# Patient Record
Sex: Female | Born: 1970 | Race: White | Hispanic: No | Smoking: Former smoker
Health system: Southern US, Community
[De-identification: ages and names within clinical notes are randomized; demographics above are authoritative.]

## PROBLEM LIST (undated history)

## (undated) DIAGNOSIS — F32A Depression, unspecified: Secondary | ICD-10-CM

## (undated) DIAGNOSIS — T7840XA Allergy, unspecified, initial encounter: Secondary | ICD-10-CM

## (undated) DIAGNOSIS — F419 Anxiety disorder, unspecified: Secondary | ICD-10-CM

## (undated) DIAGNOSIS — N83209 Unspecified ovarian cyst, unspecified side: Secondary | ICD-10-CM

## (undated) DIAGNOSIS — F329 Major depressive disorder, single episode, unspecified: Secondary | ICD-10-CM

## (undated) HISTORY — DX: Unspecified ovarian cyst, unspecified side: N83.209

## (undated) HISTORY — PX: HERNIA REPAIR: SHX51

## (undated) HISTORY — DX: Depression, unspecified: F32.A

## (undated) HISTORY — DX: Anxiety disorder, unspecified: F41.9

## (undated) HISTORY — DX: Allergy, unspecified, initial encounter: T78.40XA

## (undated) HISTORY — DX: Major depressive disorder, single episode, unspecified: F32.9

---

## 1991-03-04 HISTORY — PX: TONSILLECTOMY: SUR1361

## 1998-03-03 HISTORY — PX: WISDOM TOOTH EXTRACTION: SHX21

## 2005-10-19 ENCOUNTER — Inpatient Hospital Stay: Payer: Self-pay | Admitting: Obstetrics and Gynecology

## 2005-12-13 ENCOUNTER — Ambulatory Visit: Payer: Self-pay | Admitting: Pediatrics

## 2010-01-31 HISTORY — PX: NASAL SINUS SURGERY: SHX719

## 2010-02-08 ENCOUNTER — Ambulatory Visit: Payer: Self-pay | Admitting: Unknown Physician Specialty

## 2010-02-14 ENCOUNTER — Ambulatory Visit: Payer: Self-pay | Admitting: Unknown Physician Specialty

## 2010-07-24 ENCOUNTER — Ambulatory Visit: Payer: Self-pay | Admitting: Unknown Physician Specialty

## 2010-12-02 ENCOUNTER — Encounter: Payer: Self-pay | Admitting: Obstetrics & Gynecology

## 2010-12-02 ENCOUNTER — Ambulatory Visit (INDEPENDENT_AMBULATORY_CARE_PROVIDER_SITE_OTHER): Payer: BC Managed Care – PPO | Admitting: Obstetrics & Gynecology

## 2010-12-02 VITALS — BP 112/66 | HR 85 | Ht 68.0 in | Wt 132.0 lb

## 2010-12-02 DIAGNOSIS — N949 Unspecified condition associated with female genital organs and menstrual cycle: Secondary | ICD-10-CM

## 2010-12-02 DIAGNOSIS — N938 Other specified abnormal uterine and vaginal bleeding: Secondary | ICD-10-CM

## 2010-12-02 DIAGNOSIS — F32A Depression, unspecified: Secondary | ICD-10-CM

## 2010-12-02 DIAGNOSIS — Z113 Encounter for screening for infections with a predominantly sexual mode of transmission: Secondary | ICD-10-CM

## 2010-12-02 DIAGNOSIS — Z1272 Encounter for screening for malignant neoplasm of vagina: Secondary | ICD-10-CM

## 2010-12-02 DIAGNOSIS — Z0000 Encounter for general adult medical examination without abnormal findings: Secondary | ICD-10-CM

## 2010-12-02 DIAGNOSIS — F329 Major depressive disorder, single episode, unspecified: Secondary | ICD-10-CM

## 2010-12-02 DIAGNOSIS — Z8632 Personal history of gestational diabetes: Secondary | ICD-10-CM

## 2010-12-02 MED ORDER — ESCITALOPRAM OXALATE 10 MG PO TABS: 10.0000 mg | ORAL_TABLET | Freq: Every day | ORAL | Status: DC

## 2010-12-02 NOTE — Progress Notes (Signed)
Subjective:    Patient ID: Bianca Walton, female    DOB: [birth year], 40 y.o.   MRN: [id number]  HPI    Review of Systems     Objective:   Physical Exam        Assessment & Plan:   Subjective:    Bianca Walton is a 40 y.o. female who presents for an annual exam. She has 2 complaints.  The first is that her cycle length has increased and this last period lasted 10 days (normally 5 or 6).  She also tells me that she would like to restart Lexapro as she feels herself becoming more and more "snappy". She has also been experiencing some ill-defined BL pelvic discomfort. The patient is sexually active. GYN screening history: last pap: was normal. The patient wears seatbelts: yes. The patient participates in regular exercise: yes. Has the patient ever been transfused or tattooed?: yes. (tattoo)The patient reports that there is not domestic violence in her life.   Menstrual History: OB History    Grav Para Term Preterm Abortions TAB SAB Ect Mult Living                  Menarche age: 3 Patient's last menstrual period was 11/20/2010.    The following portions of the patient's history were reviewed and updated as appropriate: allergies, current medications, past family history, past medical history, past social history, past surgical history and problem list.  Review of Systems A comprehensive review of systems was negative.   She is an Tourist information centre manager.  Her daughter is 85 yo and she and her husband use withdrawal for birth control.  She would be happy if she conceived. Objective:    BP 112/66  Pulse 85  Ht 5\' 8"  (1.727 m)  Wt 59.875 kg (132 lb)  BMI 20.07 kg/m2  LMP 11/20/2010  General Appearance:    Alert, cooperative, no distress, appears stated age  Head:    Normocephalic, without obvious abnormality, atraumatic  Eyes:    PERRL, conjunctiva/corneas clear, EOM's intact, fundi    benign, both eyes  Ears:    Normal TM's and external ear canals, both ears  Nose:    Nares normal, septum midline, mucosa normal, no drainage    or sinus tenderness  Throat:   Lips, mucosa, and tongue normal; teeth and gums normal  Neck:   Supple, symmetrical, trachea midline, no adenopathy;    thyroid:  no enlargement/tenderness/nodules; no carotid   bruit or JVD  Back:     Symmetric, no curvature, ROM normal, no CVA tenderness  Lungs:     Clear to auscultation bilaterally, respirations unlabored  Chest Wall:    No tenderness or deformity   Heart:    Regular rate and rhythm, S1 and S2 normal, no murmur, rub   or gallop  Breast Exam:    No tenderness, masses, or nipple abnormality  Abdomen:     Soft, non-tender, bowel sounds active all four quadrants,    no masses, no organomegaly  Genitalia:    Normal female without lesion, discharge or tenderness, NSSA, NT, no adnexal masses  Rectal:     Extremities:   Extremities normal, atraumatic, no cyanosis or edema  Pulses:   2+ and symmetric all extremities  Skin:   Skin color, texture, turgor normal, no rashes or lesions  Lymph nodes:   Cervical, supraclavicular, and axillary nodes normal  Neurologic:   CNII-XII intact, normal strength, sensation and reflexes    throughout  .  Assessment:    Healthy female exam.  Change in Menstrual pattern and some pelvic discomfort. Anxiety/depression History of borderline GDM   Plan:     Await pap smear results.  Check cvx cultures and TSH, RBS and Gyn u/s. Per her request, I will prescribe Lexapro.

## 2010-12-03 LAB — GLUCOSE, RANDOM: Glucose, Bld: 68 mg/dL — ABNORMAL LOW (ref 70–99)

## 2010-12-03 LAB — TSH: TSH: 1.855 u[IU]/mL (ref 0.350–4.500)

## 2011-01-21 ENCOUNTER — Telehealth: Payer: Self-pay

## 2011-01-21 DIAGNOSIS — F32A Depression, unspecified: Secondary | ICD-10-CM

## 2011-01-21 DIAGNOSIS — F329 Major depressive disorder, single episode, unspecified: Secondary | ICD-10-CM

## 2011-01-21 MED ORDER — ESCITALOPRAM OXALATE 10 MG PO TABS: 10.0000 mg | ORAL_TABLET | Freq: Every day | ORAL | Status: DC

## 2011-01-21 NOTE — Telephone Encounter (Signed)
PATIENT NEEDS A REFILL ON HER LEXAPRO 10MG  1 MONTH WORTH SHE IS OUT WILL BE IN NEXT MONTH FOR ANNUAL PHARMACY IS [facility] [address].

## 2011-07-14 ENCOUNTER — Ambulatory Visit: Payer: BC Managed Care – PPO | Admitting: Obstetrics & Gynecology

## 2011-09-03 ENCOUNTER — Emergency Department: Payer: Self-pay | Admitting: *Deleted

## 2012-04-15 ENCOUNTER — Ambulatory Visit: Payer: BC Managed Care – PPO | Admitting: Obstetrics & Gynecology

## 2012-04-27 ENCOUNTER — Ambulatory Visit: Payer: BC Managed Care – PPO | Admitting: Family Medicine

## 2012-04-30 ENCOUNTER — Ambulatory Visit: Payer: BC Managed Care – PPO | Admitting: Obstetrics & Gynecology

## 2012-05-27 ENCOUNTER — Ambulatory Visit (INDEPENDENT_AMBULATORY_CARE_PROVIDER_SITE_OTHER): Payer: BC Managed Care – PPO | Admitting: Family Medicine

## 2012-05-27 ENCOUNTER — Encounter: Payer: Self-pay | Admitting: Family Medicine

## 2012-05-27 VITALS — BP 105/69 | HR 81 | Ht 67.0 in | Wt 142.0 lb

## 2012-05-27 DIAGNOSIS — F411 Generalized anxiety disorder: Secondary | ICD-10-CM

## 2012-05-27 DIAGNOSIS — Z01419 Encounter for gynecological examination (general) (routine) without abnormal findings: Secondary | ICD-10-CM

## 2012-05-27 DIAGNOSIS — Z124 Encounter for screening for malignant neoplasm of cervix: Secondary | ICD-10-CM

## 2012-05-27 DIAGNOSIS — J45909 Unspecified asthma, uncomplicated: Secondary | ICD-10-CM | POA: Insufficient documentation

## 2012-05-27 DIAGNOSIS — R531 Weakness: Secondary | ICD-10-CM

## 2012-05-27 DIAGNOSIS — Z1151 Encounter for screening for human papillomavirus (HPV): Secondary | ICD-10-CM

## 2012-05-27 DIAGNOSIS — F419 Anxiety disorder, unspecified: Secondary | ICD-10-CM

## 2012-05-27 DIAGNOSIS — J309 Allergic rhinitis, unspecified: Secondary | ICD-10-CM | POA: Insufficient documentation

## 2012-05-27 DIAGNOSIS — R5381 Other malaise: Secondary | ICD-10-CM

## 2012-05-27 LAB — LIPID PANEL
Cholesterol: 219 mg/dL — ABNORMAL HIGH (ref 0–200)
HDL: 73 mg/dL (ref >39)
LDL Cholesterol: 129 mg/dL — ABNORMAL HIGH (ref 0–99)
Total CHOL/HDL Ratio: 3 Ratio
Triglycerides: 84 mg/dL (ref <150)
VLDL: 17 mg/dL (ref 0–40)

## 2012-05-27 LAB — COMPREHENSIVE METABOLIC PANEL
ALT: 16 U/L (ref 0–35)
AST: 17 U/L (ref 0–37)
Albumin: 4.7 g/dL (ref 3.5–5.2)
Alkaline Phosphatase: 35 U/L — ABNORMAL LOW (ref 39–117)
BUN: 19 mg/dL (ref 6–23)
CO2: 29 mEq/L (ref 19–32)
Calcium: 9.4 mg/dL (ref 8.4–10.5)
Chloride: 104 mEq/L (ref 96–112)
Creat: 0.82 mg/dL (ref 0.50–1.10)
Glucose, Bld: 99 mg/dL (ref 70–99)
Potassium: 4.7 mEq/L (ref 3.5–5.3)
Sodium: 139 mEq/L (ref 135–145)
Total Bilirubin: 0.6 mg/dL (ref 0.3–1.2)
Total Protein: 6.5 g/dL (ref 6.0–8.3)

## 2012-05-27 LAB — CBC
HCT: 41.8 % (ref 36.0–46.0)
Hemoglobin: 14 g/dL (ref 12.0–15.0)
MCH: 31 pg (ref 26.0–34.0)
MCHC: 33.5 g/dL (ref 30.0–36.0)
MCV: 92.5 fL (ref 78.0–100.0)
Platelets: 233 10*3/uL (ref 150–400)
RBC: 4.52 MIL/uL (ref 3.87–5.11)
RDW: 13.2 % (ref 11.5–15.5)
WBC: 4.8 10*3/uL (ref 4.0–10.5)

## 2012-05-27 NOTE — Patient Instructions (Signed)
Calorie Counting Diet A calorie counting diet requires you to eat the number of calories that are right for you in a day. Calories are the measurement of how much energy you get from the food you eat. Eating the right amount of calories is important for staying at a healthy weight. If you eat too many calories, your body will store them as fat and you may gain weight. If you eat too few calories, you may lose weight. Counting the number of calories you eat during a day will help you know if you are eating the right amount. A Registered Dietitian can determine how many calories you need in a day. The amount of calories needed varies from person to person. If your goal is to lose weight, you will need to eat fewer calories. Losing weight can benefit you if you are overweight or have health problems such as heart disease, high blood pressure, or diabetes. If your goal is to gain weight, you will need to eat more calories. Gaining weight may be necessary if you have a certain health problem that causes your body to need more energy. TIPS Whether you are increasing or decreasing the number of calories you eat during a day, it may be hard to get used to changes in what you eat and drink. The following are tips to help you keep track of the number of calories you eat.  Measure foods at home with measuring cups. This helps you know the amount of food and number of calories you are eating.  Restaurants often serve food in amounts that are larger than 1 serving. While eating out, estimate how many servings of a food you are given. For example, a serving of cooked rice is  cup or about the size of half of a fist. Knowing serving sizes will help you be aware of how much food you are eating at restaurants.  Ask for smaller portion sizes or child-size portions at restaurants.  Plan to eat half of a meal at a restaurant. Take the rest home or share the other half with a friend.  Read the Nutrition Facts panel on  food labels for calorie content and serving size. You can find out how many servings are in a package, the size of a serving, and the number of calories each serving has.  For example, a package might contain 3 cookies. The Nutrition Facts panel on that package says that 1 serving is 1 cookie. Below that, it will say there are 3 servings in the container. The calories section of the Nutrition Facts label says there are 90 calories. This means there are 90 calories in 1 cookie (1 serving). If you eat 1 cookie you have eaten 90 calories. If you eat all 3 cookies, you have eaten 270 calories (3 servings x 90 calories = 270 calories). The list below tells you how big or small some common portion sizes are.  1 oz.........4 stacked dice.  3 oz.........Deck of cards.  1 tsp........Tip of little finger.  1 tbs........Thumb.  2 tbs........Golf ball.   cup.......Half of a fist.  1 cup........A fist. KEEP A FOOD LOG Write down every food item you eat, the amount you eat, and the number of calories in each food you eat during the day. At the end of the day, you can add up the total number of calories you have eaten. It may help to keep a list like the one below. Find out the calorie information by reading the   Nutrition Facts panel on food labels. Breakfast  Bran cereal (1 cup, 110 calories).  Fat-free milk ( cup, 45 calories). Snack  Apple (1 medium, 80 calories). Lunch  Spinach (1 cup, 20 calories).  Tomato ( medium, 20 calories).  Chicken breast strips (3 oz, 165 calories).  Shredded cheddar cheese ( cup, 110 calories).  Light [place] dressing (2 tbs, 60 calories).  Whole-wheat bread (1 slice, 80 calories).  Tub margarine (1 tsp, 35 calories).  Vegetable soup (1 cup, 160 calories). Dinner  Pork chop (3 oz, 190 calories).  Brown rice (1 cup, 215 calories).  Steamed broccoli ( cup, 20 calories).  Strawberries (1  cup, 65 calories).  Whipped cream (1 tbs, 50  calories). Daily Calorie Total: 1425 Document Released: 02/17/2005 Document Revised: 05/12/2011 Document Reviewed: 08/14/2006 [facility] Patient Information 2013 [place], [place]. Preventive Care for Adults, Female A healthy lifestyle and preventive care can promote health and wellness. Preventive health guidelines for women include the following key practices.  A routine yearly physical is a good way to check with your caregiver about your health and preventive screening. It is a chance to share any concerns and updates on your health, and to receive a thorough exam.  Visit your dentist for a routine exam and preventive care every 6 months. Brush your teeth twice a day and floss once a day. Good oral hygiene prevents tooth decay and gum disease.  The frequency of eye exams is based on your age, health, family medical history, use of contact lenses, and other factors. Follow your caregiver's recommendations for frequency of eye exams.  Eat a healthy diet. Foods like vegetables, fruits, whole grains, low-fat dairy products, and lean protein foods contain the nutrients you need without too many calories. Decrease your intake of foods high in solid fats, added sugars, and salt. Eat the right amount of calories for you.Get information about a proper diet from your caregiver, if necessary.  Regular physical exercise is one of the most important things you can do for your health. Most adults should get at least 150 minutes of moderate-intensity exercise (any activity that increases your heart rate and causes you to sweat) each week. In addition, most adults need muscle-strengthening exercises on 2 or more days a week.  Maintain a healthy weight. The body mass index (BMI) is a screening tool to identify possible weight problems. It provides an estimate of body fat based on height and weight. Your caregiver can help determine your BMI, and can help you achieve or maintain a healthy weight.For adults 20 years  and older:  A BMI below 18.5 is considered underweight.  A BMI of 18.5 to 24.9 is normal.  A BMI of 25 to 29.9 is considered overweight.  A BMI of 30 and above is considered obese.  Maintain normal blood lipids and cholesterol levels by exercising and minimizing your intake of saturated fat. Eat a balanced diet with plenty of fruit and vegetables. Blood tests for lipids and cholesterol should begin at age 37 and be repeated every 5 years. If your lipid or cholesterol levels are high, you are over 50, or you are at high risk for heart disease, you may need your cholesterol levels checked more frequently.Ongoing high lipid and cholesterol levels should be treated with medicines if diet and exercise are not effective.  If you smoke, find out from your caregiver how to quit. If you do not use tobacco, do not start.  If you are pregnant, do not drink alcohol. If you  are breastfeeding, be very cautious about drinking alcohol. If you are not pregnant and choose to drink alcohol, do not exceed 1 drink per day. One drink is considered to be 12 ounces (355 mL) of beer, 5 ounces (148 mL) of wine, or 1.5 ounces (44 mL) of liquor.  Avoid use of street drugs. Do not share needles with anyone. Ask for help if you need support or instructions about stopping the use of drugs.  High blood pressure causes heart disease and increases the risk of stroke. Your blood pressure should be checked at least every 1 to 2 years. Ongoing high blood pressure should be treated with medicines if weight loss and exercise are not effective.  If you are 62 to 41 years old, ask your caregiver if you should take aspirin to prevent strokes.  Diabetes screening involves taking a blood sample to check your fasting blood sugar level. This should be done once every 3 years, after age 70, if you are within normal weight and without risk factors for diabetes. Testing should be considered at a younger age or be carried out more frequently  if you are overweight and have at least 1 risk factor for diabetes.  Breast cancer screening is essential preventive care for women. You should practice "breast self-awareness." This means understanding the normal appearance and feel of your breasts and may include breast self-examination. Any changes detected, no matter how small, should be reported to a caregiver. Women in their 67s and 30s should have a clinical breast exam (CBE) by a caregiver as part of a regular health exam every 1 to 3 years. After age 58, women should have a CBE every year. Starting at age 96, women should consider having a mammography (breast X-ray test) every year. Women who have a family history of breast cancer should talk to their caregiver about genetic screening. Women at a high risk of breast cancer should talk to their caregivers about having magnetic resonance imaging (MRI) and a mammography every year.  The Pap test is a screening test for cervical cancer. A Pap test can show cell changes on the cervix that might become cervical cancer if left untreated. A Pap test is a procedure in which cells are obtained and examined from the lower end of the uterus (cervix).  Women should have a Pap test starting at age 1.  Between ages 29 and 25, Pap tests should be repeated every 2 years.  Beginning at age 62, you should have a Pap test every 3 years as long as the past 3 Pap tests have been normal.  Some women have medical problems that increase the chance of getting cervical cancer. Talk to your caregiver about these problems. It is especially important to talk to your caregiver if a new problem develops soon after your last Pap test. In these cases, your caregiver may recommend more frequent screening and Pap tests.  The above recommendations are the same for women who have or have not gotten the vaccine for human papillomavirus (HPV).  If you had a hysterectomy for a problem that was not cancer or a condition that could  lead to cancer, then you no longer need Pap tests. Even if you no longer need a Pap test, a regular exam is a good idea to make sure no other problems are starting.  If you are between ages 75 and 15, and you have had normal Pap tests going back 10 years, you no longer need Pap tests. Even if  you no longer need a Pap test, a regular exam is a good idea to make sure no other problems are starting.  If you have had past treatment for cervical cancer or a condition that could lead to cancer, you need Pap tests and screening for cancer for at least 20 years after your treatment.  If Pap tests have been discontinued, risk factors (such as a new sexual partner) need to be reassessed to determine if screening should be resumed.  The HPV test is an additional test that may be used for cervical cancer screening. The HPV test looks for the virus that can cause the cell changes on the cervix. The cells collected during the Pap test can be tested for HPV. The HPV test could be used to screen women aged 38 years and older, and should be used in women of any age who have unclear Pap test results. After the age of 83, women should have HPV testing at the same frequency as a Pap test.  Colorectal cancer can be detected and often prevented. Most routine colorectal cancer screening begins at the age of 72 and continues through age 41. However, your caregiver may recommend screening at an earlier age if you have risk factors for colon cancer. On a yearly basis, your caregiver may provide home test kits to check for hidden blood in the stool. Use of a small camera at the end of a tube, to directly examine the colon (sigmoidoscopy or colonoscopy), can detect the earliest forms of colorectal cancer. Talk to your caregiver about this at age 30, when routine screening begins. Direct examination of the colon should be repeated every 5 to 10 years through age 17, unless early forms of pre-cancerous polyps or small growths are  found.  Hepatitis C blood testing is recommended for all people born from [birth year] through 1965 and any individual with known risks for hepatitis C.  Practice safe sex. Use condoms and avoid high-risk sexual practices to reduce the spread of sexually transmitted infections (STIs). STIs include gonorrhea, chlamydia, syphilis, trichomonas, herpes, HPV, and human immunodeficiency virus (HIV). Herpes, HIV, and HPV are viral illnesses that have no cure. They can result in disability, cancer, and death. Sexually active women aged 6 and younger should be checked for chlamydia. Older women with new or multiple partners should also be tested for chlamydia. Testing for other STIs is recommended if you are sexually active and at increased risk.  Osteoporosis is a disease in which the bones lose minerals and strength with aging. This can result in serious bone fractures. The risk of osteoporosis can be identified using a bone density scan. Women ages 21 and over and women at risk for fractures or osteoporosis should discuss screening with their caregivers. Ask your caregiver whether you should take a calcium supplement or vitamin D to reduce the rate of osteoporosis.  Menopause can be associated with physical symptoms and risks. Hormone replacement therapy is available to decrease symptoms and risks. You should talk to your caregiver about whether hormone replacement therapy is right for you.  Use sunscreen with sun protection factor (SPF) of 30 or more. Apply sunscreen liberally and repeatedly throughout the day. You should seek shade when your shadow is shorter than you. Protect yourself by wearing long sleeves, pants, a wide-brimmed hat, and sunglasses year round, whenever you are outdoors.  Once a month, do a whole body skin exam, using a mirror to look at the skin on your back. Notify your caregiver of  new moles, moles that have irregular borders, moles that are larger than a pencil eraser, or moles that have  changed in shape or color.  Stay current with required immunizations.  Influenza. You need a dose every fall (or winter). The composition of the flu vaccine changes each year, so being vaccinated once is not enough.  Pneumococcal polysaccharide. You need 1 to 2 doses if you smoke cigarettes or if you have certain chronic medical conditions. You need 1 dose at age 45 (or older) if you have never been vaccinated.  Tetanus, diphtheria, pertussis (Tdap, Td). Get 1 dose of Tdap vaccine if you are younger than age 4, are over 23 and have contact with an infant, are a Research scientist (physical sciences), are pregnant, or simply want to be protected from whooping cough. After that, you need a Td booster dose every 10 years. Consult your caregiver if you have not had at least 3 tetanus and diphtheria-containing shots sometime in your life or have a deep or dirty wound.  HPV. You need this vaccine if you are a woman age 28 or younger. The vaccine is given in 3 doses over 6 months.  Measles, mumps, rubella (MMR). You need at least 1 dose of MMR if you were born in 1957 or later. You may also need a second dose.  Meningococcal. If you are age 27 to 91 and a first-year college student living in a residence hall, or have one of several medical conditions, you need to get vaccinated against meningococcal disease. You may also need additional booster doses.  Zoster (shingles). If you are age 6 or older, you should get this vaccine.  Varicella (chickenpox). If you have never had chickenpox or you were vaccinated but received only 1 dose, talk to your caregiver to find out if you need this vaccine.  Hepatitis A. You need this vaccine if you have a specific risk factor for hepatitis A virus infection or you simply wish to be protected from this disease. The vaccine is usually given as 2 doses, 6 to 18 months apart.  Hepatitis B. You need this vaccine if you have a specific risk factor for hepatitis B virus infection or you  simply wish to be protected from this disease. The vaccine is given in 3 doses, usually over 6 months. Preventive Services / Frequency Ages 57 to 75  Blood pressure check.** / Every 1 to 2 years.  Lipid and cholesterol check.** / Every 5 years beginning at age 63.  Clinical breast exam.** / Every 3 years for women in their 5s and 30s.  Pap test.** / Every 2 years from ages 16 through 85. Every 3 years starting at age 17 through age 19 or 45 with a history of 3 consecutive normal Pap tests.  HPV screening.** / Every 3 years from ages 65 through ages 11 to 48 with a history of 3 consecutive normal Pap tests.  Hepatitis C blood test.** / For any individual with known risks for hepatitis C.  Skin self-exam. / Monthly.  Influenza immunization.** / Every year.  Pneumococcal polysaccharide immunization.** / 1 to 2 doses if you smoke cigarettes or if you have certain chronic medical conditions.  Tetanus, diphtheria, pertussis (Tdap, Td) immunization. / A one-time dose of Tdap vaccine. After that, you need a Td booster dose every 10 years.  HPV immunization. / 3 doses over 6 months, if you are 19 and younger.  Measles, mumps, rubella (MMR) immunization. / You need at least 1 dose of MMR  if you were born in 1957 or later. You may also need a second dose.  Meningococcal immunization. / 1 dose if you are age 50 to 83 and a first-year college student living in a residence hall, or have one of several medical conditions, you need to get vaccinated against meningococcal disease. You may also need additional booster doses.  Varicella immunization.** / Consult your caregiver.  Hepatitis A immunization.** / Consult your caregiver. 2 doses, 6 to 18 months apart.  Hepatitis B immunization.** / Consult your caregiver. 3 doses usually over 6 months. Ages 26 to 66  Blood pressure check.** / Every 1 to 2 years.  Lipid and cholesterol check.** / Every 5 years beginning at age 46.  Clinical breast  exam.** / Every year after age 62.  Mammogram.** / Every year beginning at age 63 and continuing for as long as you are in good health. Consult with your caregiver.  Pap test.** / Every 3 years starting at age 25 through age 18 or 31 with a history of 3 consecutive normal Pap tests.  HPV screening.** / Every 3 years from ages 5 through ages 24 to 26 with a history of 3 consecutive normal Pap tests.  Fecal occult blood test (FOBT) of stool. / Every year beginning at age 26 and continuing until age 80. You may not need to do this test if you get a colonoscopy every 10 years.  Flexible sigmoidoscopy or colonoscopy.** / Every 5 years for a flexible sigmoidoscopy or every 10 years for a colonoscopy beginning at age 25 and continuing until age 80.  Hepatitis C blood test.** / For all people born from [birth year] through 1964 and any individual with known risks for hepatitis C.  Skin self-exam. / Monthly.  Influenza immunization.** / Every year.  Pneumococcal polysaccharide immunization.** / 1 to 2 doses if you smoke cigarettes or if you have certain chronic medical conditions.  Tetanus, diphtheria, pertussis (Tdap, Td) immunization.** / A one-time dose of Tdap vaccine. After that, you need a Td booster dose every 10 years.  Measles, mumps, rubella (MMR) immunization. / You need at least 1 dose of MMR if you were born in 1957 or later. You may also need a second dose.  Varicella immunization.** / Consult your caregiver.  Meningococcal immunization.** / Consult your caregiver.  Hepatitis A immunization.** / Consult your caregiver. 2 doses, 6 to 18 months apart.  Hepatitis B immunization.** / Consult your caregiver. 3 doses, usually over 6 months. Ages 11 and over  Blood pressure check.** / Every 1 to 2 years.  Lipid and cholesterol check.** / Every 5 years beginning at age 30.  Clinical breast exam.** / Every year after age 32.  Mammogram.** / Every year beginning at age 95 and continuing  for as long as you are in good health. Consult with your caregiver.  Pap test.** / Every 3 years starting at age 91 through age 52 or 18 with a 3 consecutive normal Pap tests. Testing can be stopped between 65 and 70 with 3 consecutive normal Pap tests and no abnormal Pap or HPV tests in the past 10 years.  HPV screening.** / Every 3 years from ages 23 through ages 30 or 44 with a history of 3 consecutive normal Pap tests. Testing can be stopped between 65 and 70 with 3 consecutive normal Pap tests and no abnormal Pap or HPV tests in the past 10 years.  Fecal occult blood test (FOBT) of stool. / Every year beginning at age  50 and continuing until age 83. You may not need to do this test if you get a colonoscopy every 10 years.  Flexible sigmoidoscopy or colonoscopy.** / Every 5 years for a flexible sigmoidoscopy or every 10 years for a colonoscopy beginning at age 45 and continuing until age 4.  Hepatitis C blood test.** / For all people born from [birth year] through 1964 and any individual with known risks for hepatitis C.  Osteoporosis screening.** / A one-time screening for women ages 50 and over and women at risk for fractures or osteoporosis.  Skin self-exam. / Monthly.  Influenza immunization.** / Every year.  Pneumococcal polysaccharide immunization.** / 1 dose at age 10 (or older) if you have never been vaccinated.  Tetanus, diphtheria, pertussis (Tdap, Td) immunization. / A one-time dose of Tdap vaccine if you are over 65 and have contact with an infant, are a Research scientist (physical sciences), or simply want to be protected from whooping cough. After that, you need a Td booster dose every 10 years.  Varicella immunization.** / Consult your caregiver.  Meningococcal immunization.** / Consult your caregiver.  Hepatitis A immunization.** / Consult your caregiver. 2 doses, 6 to 18 months apart.  Hepatitis B immunization.** / Check with your caregiver. 3 doses, usually over 6 months. ** Family history  and personal history of risk and conditions may change your caregiver's recommendations. Document Released: 04/15/2001 Document Revised: 05/12/2011 Document Reviewed: 07/15/2010 [facility] Patient Information 2013 [place], [place].

## 2012-05-27 NOTE — Progress Notes (Signed)
  Subjective:     Bianca Walton is a 42 y.o. female and is here for a comprehensive physical exam. The patient reports spotting x 1 after her last cycle, some 2 wks later for one day.  Also, is now gaining weight and trying to lose 10 lbs.  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  Withdrawal method for contraception.  Reports chronic constipation, improves with fiber.  Having lower abd. Pain which has resolved since improved bowel habits.  History   Social History  . Marital Status: Married    Spouse Name: N/A    Number of Children: N/A  . Years of Education: N/A   Occupational History  . Not on file.   Social History Main Topics  . Smoking status: Former Smoker    Quit date: 12/01/2005  . Smokeless tobacco: Not on file  . Alcohol Use: Yes     Comment: 3 drinks a week.  . Drug Use: No  . Sexually Active: Yes -- Female partner(s)    Birth Control/ Protection: Coitus interruptus   Other Topics Concern  . Not on file   Social History Narrative  . No narrative on file   Health Maintenance  Topic Date Due  . Influenza Vaccine  11/01/1972  . Tetanus/tdap  02/12/1991  . Pap Smear  12/01/2013   Works as a Runner, broadcasting/film/video, Armed forces logistics/support/administrative officer.  The following portions of the patient's history were reviewed and updated as appropriate: allergies, current medications, past family history, past medical history, past social history, past surgical history and problem list.  Review of Systems Pertinent items are noted in HPI.   Objective:    BP 105/69  Pulse 81  Ht 5\' 7"  (1.702 m)  Wt 142 lb (64.411 kg)  BMI 22.24 kg/m2  LMP 05/06/2012 General appearance: alert, cooperative and appears stated age Head: Normocephalic, without obvious abnormality, atraumatic Neck: no adenopathy, supple, symmetrical, trachea midline and thyroid not enlarged, symmetric, no tenderness/mass/nodules Lungs: clear to auscultation bilaterally Breasts: normal  appearance, no masses or tenderness Heart: regular rate and rhythm, S1, S2 normal, no murmur, click, rub or gallop Abdomen: soft, non-tender; bowel sounds normal; no masses,  no organomegaly Pelvic: cervix normal in appearance, external genitalia normal, no adnexal masses or tenderness, no cervical motion tenderness, uterus normal size, shape, and consistency and vagina normal without discharge Extremities: extremities normal, atraumatic, no cyanosis or edema Pulses: 2+ and symmetric Skin: Skin color, texture, turgor normal. No rashes or lesions Lymph nodes: Cervical, supraclavicular, and axillary nodes normal. Neurologic: Grossly normal    Assessment:    Healthy female exam.    Plan:  Pap smear Annual mammogram Screening blood work.   See After Visit Summary for Counseling Recommendations

## 2012-05-28 LAB — TSH: TSH: 1.967 u[IU]/mL (ref 0.350–4.500)

## 2012-05-31 ENCOUNTER — Telehealth: Payer: Self-pay | Admitting: *Deleted

## 2012-05-31 NOTE — Telephone Encounter (Signed)
Notified patient of her lab results and advised a low cholesterol diet.

## 2012-06-01 ENCOUNTER — Ambulatory Visit (HOSPITAL_COMMUNITY): Payer: BC Managed Care – PPO

## 2013-12-13 ENCOUNTER — Ambulatory Visit: Payer: BC Managed Care – PPO | Admitting: Obstetrics & Gynecology

## 2014-01-02 ENCOUNTER — Encounter: Payer: Self-pay | Admitting: Family Medicine

## 2014-07-17 ENCOUNTER — Ambulatory Visit: Payer: BC Managed Care – PPO | Admitting: Obstetrics & Gynecology

## 2014-08-07 ENCOUNTER — Ambulatory Visit (INDEPENDENT_AMBULATORY_CARE_PROVIDER_SITE_OTHER): Payer: BC Managed Care – PPO | Admitting: Family Medicine

## 2014-08-07 ENCOUNTER — Encounter: Payer: Self-pay | Admitting: Family Medicine

## 2014-08-07 VITALS — BP 112/74 | HR 71 | Ht 67.0 in | Wt 149.0 lb

## 2014-08-07 DIAGNOSIS — Z01419 Encounter for gynecological examination (general) (routine) without abnormal findings: Secondary | ICD-10-CM

## 2014-08-07 DIAGNOSIS — Z1151 Encounter for screening for human papillomavirus (HPV): Secondary | ICD-10-CM

## 2014-08-07 DIAGNOSIS — Z124 Encounter for screening for malignant neoplasm of cervix: Secondary | ICD-10-CM

## 2014-08-07 DIAGNOSIS — N939 Abnormal uterine and vaginal bleeding, unspecified: Secondary | ICD-10-CM

## 2014-08-07 DIAGNOSIS — K9041 Non-celiac gluten sensitivity: Secondary | ICD-10-CM

## 2014-08-07 DIAGNOSIS — Z91018 Allergy to other foods: Secondary | ICD-10-CM

## 2014-08-07 NOTE — Patient Instructions (Addendum)
Preventive Care for Adults A healthy lifestyle and preventive care can promote health and wellness. Preventive health guidelines for women include the following key practices.  A routine yearly physical is a good way to check with your health care provider about your health and preventive screening. It is a chance to share any concerns and updates on your health and to receive a thorough exam.  Visit your dentist for a routine exam and preventive care every 6 months. Brush your teeth twice a day and floss once a day. Good oral hygiene prevents tooth decay and gum disease.  The frequency of eye exams is based on your age, health, family medical history, use of contact lenses, and other factors. Follow your health care provider's recommendations for frequency of eye exams.  Eat a healthy diet. Foods like vegetables, fruits, whole grains, low-fat dairy products, and lean protein foods contain the nutrients you need without too many calories. Decrease your intake of foods high in solid fats, added sugars, and salt. Eat the right amount of calories for you.Get information about a proper diet from your health care provider, if necessary.  Regular physical exercise is one of the most important things you can do for your health. Most adults should get at least 150 minutes of moderate-intensity exercise (any activity that increases your heart rate and causes you to sweat) each week. In addition, most adults need muscle-strengthening exercises on 2 or more days a week.  Maintain a healthy weight. The body mass index (BMI) is a screening tool to identify possible weight problems. It provides an estimate of body fat based on height and weight. Your health care provider can find your BMI and can help you achieve or maintain a healthy weight.For adults 20 years and older:  A BMI below 18.5 is considered underweight.  A BMI of 18.5 to 24.9 is normal.  A BMI of 25 to 29.9 is considered overweight.  A BMI of  30 and above is considered obese.  Maintain normal blood lipids and cholesterol levels by exercising and minimizing your intake of saturated fat. Eat a balanced diet with plenty of fruit and vegetables. Blood tests for lipids and cholesterol should begin at age 20 and be repeated every 5 years. If your lipid or cholesterol levels are high, you are over 50, or you are at high risk for heart disease, you may need your cholesterol levels checked more frequently.Ongoing high lipid and cholesterol levels should be treated with medicines if diet and exercise are not working.  If you smoke, find out from your health care provider how to quit. If you do not use tobacco, do not start.  Lung cancer screening is recommended for adults aged 55-80 years who are at high risk for developing lung cancer because of a history of smoking. A yearly low-dose CT scan of the lungs is recommended for people who have at least a 30-pack-year history of smoking and are a current smoker or have quit within the past 15 years. A pack year of smoking is smoking an average of 1 pack of cigarettes a day for 1 year (for example: 1 pack a day for 30 years or 2 packs a day for 15 years). Yearly screening should continue until the smoker has stopped smoking for at least 15 years. Yearly screening should be stopped for people who develop a health problem that would prevent them from having lung cancer treatment.  If you are pregnant, do not drink alcohol. If you are breastfeeding,   be very cautious about drinking alcohol. If you are not pregnant and choose to drink alcohol, do not have more than 1 drink per day. One drink is considered to be 12 ounces (355 mL) of beer, 5 ounces (148 mL) of wine, or 1.5 ounces (44 mL) of liquor.  Avoid use of street drugs. Do not share needles with anyone. Ask for help if you need support or instructions about stopping the use of drugs.  High blood pressure causes heart disease and increases the risk of  stroke. Your blood pressure should be checked at least every 1 to 2 years. Ongoing high blood pressure should be treated with medicines if weight loss and exercise do not work.  If you are 75-52 years old, ask your health care provider if you should take aspirin to prevent strokes.  Diabetes screening involves taking a blood sample to check your fasting blood sugar level. This should be done once every 3 years, after age 15, if you are within normal weight and without risk factors for diabetes. Testing should be considered at a younger age or be carried out more frequently if you are overweight and have at least 1 risk factor for diabetes.  Breast cancer screening is essential preventive care for women. You should practice "breast self-awareness." This means understanding the normal appearance and feel of your breasts and may include breast self-examination. Any changes detected, no matter how small, should be reported to a health care provider. Women in their 58s and 30s should have a clinical breast exam (CBE) by a health care provider as part of a regular health exam every 1 to 3 years. After age 16, women should have a CBE every year. Starting at age 53, women should consider having a mammogram (breast X-ray test) every year. Women who have a family history of breast cancer should talk to their health care provider about genetic screening. Women at a high risk of breast cancer should talk to their health care providers about having an MRI and a mammogram every year.  Breast cancer gene (BRCA)-related cancer risk assessment is recommended for women who have family members with BRCA-related cancers. BRCA-related cancers include breast, ovarian, tubal, and peritoneal cancers. Having family members with these cancers may be associated with an increased risk for harmful changes (mutations) in the breast cancer genes BRCA1 and BRCA2. Results of the assessment will determine the need for genetic counseling and  BRCA1 and BRCA2 testing.  Routine pelvic exams to screen for cancer are no longer recommended for nonpregnant women who are considered low risk for cancer of the pelvic organs (ovaries, uterus, and vagina) and who do not have symptoms. Ask your health care provider if a screening pelvic exam is right for you.  If you have had past treatment for cervical cancer or a condition that could lead to cancer, you need Pap tests and screening for cancer for at least 20 years after your treatment. If Pap tests have been discontinued, your risk factors (such as having a new sexual partner) need to be reassessed to determine if screening should be resumed. Some women have medical problems that increase the chance of getting cervical cancer. In these cases, your health care provider may recommend more frequent screening and Pap tests.  The HPV test is an additional test that may be used for cervical cancer screening. The HPV test looks for the virus that can cause the cell changes on the cervix. The cells collected during the Pap test can be  tested for HPV. The HPV test could be used to screen women aged 30 years and older, and should be used in women of any age who have unclear Pap test results. After the age of 30, women should have HPV testing at the same frequency as a Pap test.  Colorectal cancer can be detected and often prevented. Most routine colorectal cancer screening begins at the age of 50 years and continues through age 75 years. However, your health care provider may recommend screening at an earlier age if you have risk factors for colon cancer. On a yearly basis, your health care provider may provide home test kits to check for hidden blood in the stool. Use of a small camera at the end of a tube, to directly examine the colon (sigmoidoscopy or colonoscopy), can detect the earliest forms of colorectal cancer. Talk to your health care provider about this at age 50, when routine screening begins. Direct  exam of the colon should be repeated every 5-10 years through age 75 years, unless early forms of pre-cancerous polyps or small growths are found.  People who are at an increased risk for hepatitis B should be screened for this virus. You are considered at high risk for hepatitis B if:  You were born in a country where hepatitis B occurs often. Talk with your health care provider about which countries are considered high risk.  Your parents were born in a high-risk country and you have not received a shot to protect against hepatitis B (hepatitis B vaccine).  You have HIV or AIDS.  You use needles to inject street drugs.  You live with, or have sex with, someone who has hepatitis B.  You get hemodialysis treatment.  You take certain medicines for conditions like cancer, organ transplantation, and autoimmune conditions.  Hepatitis C blood testing is recommended for all people born from 1945 through 1965 and any individual with known risks for hepatitis C.  Practice safe sex. Use condoms and avoid high-risk sexual practices to reduce the spread of sexually transmitted infections (STIs). STIs include gonorrhea, chlamydia, syphilis, trichomonas, herpes, HPV, and human immunodeficiency virus (HIV). Herpes, HIV, and HPV are viral illnesses that have no cure. They can result in disability, cancer, and death.  You should be screened for sexually transmitted illnesses (STIs) including gonorrhea and chlamydia if:  You are sexually active and are younger than 24 years.  You are older than 24 years and your health care provider tells you that you are at risk for this type of infection.  Your sexual activity has changed since you were last screened and you are at an increased risk for chlamydia or gonorrhea. Ask your health care provider if you are at risk.  If you are at risk of being infected with HIV, it is recommended that you take a prescription medicine daily to prevent HIV infection. This is  called preexposure prophylaxis (PrEP). You are considered at risk if:  You are a heterosexual woman, are sexually active, and are at increased risk for HIV infection.  You take drugs by injection.  You are sexually active with a partner who has HIV.  Talk with your health care provider about whether you are at high risk of being infected with HIV. If you choose to begin PrEP, you should first be tested for HIV. You should then be tested every 3 months for as long as you are taking PrEP.  Osteoporosis is a disease in which the bones lose minerals and strength   with aging. This can result in serious bone fractures or breaks. The risk of osteoporosis can be identified using a bone density scan. Women ages 65 years and over and women at risk for fractures or osteoporosis should discuss screening with their health care providers. Ask your health care provider whether you should take a calcium supplement or vitamin D to reduce the rate of osteoporosis.  Menopause can be associated with physical symptoms and risks. Hormone replacement therapy is available to decrease symptoms and risks. You should talk to your health care provider about whether hormone replacement therapy is right for you.  Use sunscreen. Apply sunscreen liberally and repeatedly throughout the day. You should seek shade when your shadow is shorter than you. Protect yourself by wearing long sleeves, pants, a wide-brimmed hat, and sunglasses year round, whenever you are outdoors.  Once a month, do a whole body skin exam, using a mirror to look at the skin on your back. Tell your health care provider of new moles, moles that have irregular borders, moles that are larger than a pencil eraser, or moles that have changed in shape or color.  Stay current with required vaccines (immunizations).  Influenza vaccine. All adults should be immunized every year.  Tetanus, diphtheria, and acellular pertussis (Td, Tdap) vaccine. Pregnant women should  receive 1 dose of Tdap vaccine during each pregnancy. The dose should be obtained regardless of the length of time since the last dose. Immunization is preferred during the 27th-36th week of gestation. An adult who has not previously received Tdap or who does not know her vaccine status should receive 1 dose of Tdap. This initial dose should be followed by tetanus and diphtheria toxoids (Td) booster doses every 10 years. Adults with an unknown or incomplete history of completing a 3-dose immunization series with Td-containing vaccines should begin or complete a primary immunization series including a Tdap dose. Adults should receive a Td booster every 10 years.  Varicella vaccine. An adult without evidence of immunity to varicella should receive 2 doses or a second dose if she has previously received 1 dose. Pregnant females who do not have evidence of immunity should receive the first dose after pregnancy. This first dose should be obtained before leaving the health care facility. The second dose should be obtained 4-8 weeks after the first dose.  Human papillomavirus (HPV) vaccine. Females aged 13-26 years who have not received the vaccine previously should obtain the 3-dose series. The vaccine is not recommended for use in pregnant females. However, pregnancy testing is not needed before receiving a dose. If a female is found to be pregnant after receiving a dose, no treatment is needed. In that case, the remaining doses should be delayed until after the pregnancy. Immunization is recommended for any person with an immunocompromised condition through the age of 26 years if she did not get any or all doses earlier. During the 3-dose series, the second dose should be obtained 4-8 weeks after the first dose. The third dose should be obtained 24 weeks after the first dose and 16 weeks after the second dose.  Zoster vaccine. One dose is recommended for adults aged 60 years or older unless certain conditions are  present.  Measles, mumps, and rubella (MMR) vaccine. Adults born before 1957 generally are considered immune to measles and mumps. Adults born in 1957 or later should have 1 or more doses of MMR vaccine unless there is a contraindication to the vaccine or there is laboratory evidence of immunity to   each of the three diseases. A routine second dose of MMR vaccine should be obtained at least 28 days after the first dose for students attending postsecondary schools, health care workers, or international travelers. People who received inactivated measles vaccine or an unknown type of measles vaccine during 1963-1967 should receive 2 doses of MMR vaccine. People who received inactivated mumps vaccine or an unknown type of mumps vaccine before 1979 and are at high risk for mumps infection should consider immunization with 2 doses of MMR vaccine. For females of childbearing age, rubella immunity should be determined. If there is no evidence of immunity, females who are not pregnant should be vaccinated. If there is no evidence of immunity, females who are pregnant should delay immunization until after pregnancy. Unvaccinated health care workers born before 1957 who lack laboratory evidence of measles, mumps, or rubella immunity or laboratory confirmation of disease should consider measles and mumps immunization with 2 doses of MMR vaccine or rubella immunization with 1 dose of MMR vaccine.  Pneumococcal 13-valent conjugate (PCV13) vaccine. When indicated, a person who is uncertain of her immunization history and has no record of immunization should receive the PCV13 vaccine. An adult aged 19 years or older who has certain medical conditions and has not been previously immunized should receive 1 dose of PCV13 vaccine. This PCV13 should be followed with a dose of pneumococcal polysaccharide (PPSV23) vaccine. The PPSV23 vaccine dose should be obtained at least 8 weeks after the dose of PCV13 vaccine. An adult aged 19  years or older who has certain medical conditions and previously received 1 or more doses of PPSV23 vaccine should receive 1 dose of PCV13. The PCV13 vaccine dose should be obtained 1 or more years after the last PPSV23 vaccine dose.  Pneumococcal polysaccharide (PPSV23) vaccine. When PCV13 is also indicated, PCV13 should be obtained first. All adults aged 65 years and older should be immunized. An adult younger than age 65 years who has certain medical conditions should be immunized. Any person who resides in a nursing home or long-term care facility should be immunized. An adult smoker should be immunized. People with an immunocompromised condition and certain other conditions should receive both PCV13 and PPSV23 vaccines. People with human immunodeficiency virus (HIV) infection should be immunized as soon as possible after diagnosis. Immunization during chemotherapy or radiation therapy should be avoided. Routine use of PPSV23 vaccine is not recommended for American Indians, [place] Natives, or people younger than 65 years unless there are medical conditions that require PPSV23 vaccine. When indicated, people who have unknown immunization and have no record of immunization should receive PPSV23 vaccine. One-time revaccination 5 years after the first dose of PPSV23 is recommended for people aged 19-64 years who have chronic kidney failure, nephrotic syndrome, asplenia, or immunocompromised conditions. People who received 1-2 doses of PPSV23 before age 65 years should receive another dose of PPSV23 vaccine at age 65 years or later if at least 5 years have passed since the previous dose. Doses of PPSV23 are not needed for people immunized with PPSV23 at or after age 65 years.  Meningococcal vaccine. Adults with asplenia or persistent complement component deficiencies should receive 2 doses of quadrivalent meningococcal conjugate (MenACWY-D) vaccine. The doses should be obtained at least 2 months apart.  Microbiologists working with certain meningococcal bacteria, military recruits, people at risk during an outbreak, and people who travel to or live in countries with a high rate of meningitis should be immunized. A first-year college student up through age   21 years who is living in a residence hall should receive a dose if she did not receive a dose on or after her 16th birthday. Adults who have certain high-risk conditions should receive one or more doses of vaccine.  Hepatitis A vaccine. Adults who wish to be protected from this disease, have certain high-risk conditions, work with hepatitis A-infected animals, work in hepatitis A research labs, or travel to or work in countries with a high rate of hepatitis A should be immunized. Adults who were previously unvaccinated and who anticipate close contact with an international adoptee during the first 60 days after arrival in the [place] from a country with a high rate of hepatitis A should be immunized.  Hepatitis B vaccine. Adults who wish to be protected from this disease, have certain high-risk conditions, may be exposed to blood or other infectious body fluids, are household contacts or sex partners of hepatitis B positive people, are clients or workers in certain care facilities, or travel to or work in countries with a high rate of hepatitis B should be immunized.  Haemophilus influenzae type b (Hib) vaccine. A previously unvaccinated person with asplenia or sickle cell disease or having a scheduled splenectomy should receive 1 dose of Hib vaccine. Regardless of previous immunization, a recipient of a hematopoietic stem cell transplant should receive a 3-dose series 6-12 months after her successful transplant. Hib vaccine is not recommended for adults with HIV infection. Preventive Services / Frequency Ages 64 to 68 years  Blood pressure check.** / Every 1 to 2 years.  Lipid and cholesterol check.** / Every 5 years beginning at age  22.  Clinical breast exam.** / Every 3 years for women in their 88s and 53s.  BRCA-related cancer risk assessment.** / For women who have family members with a BRCA-related cancer (breast, ovarian, tubal, or peritoneal cancers).  Pap test.** / Every 2 years from ages 90 through 51. Every 3 years starting at age 21 through age 56 or 3 with a history of 3 consecutive normal Pap tests.  HPV screening.** / Every 3 years from ages 24 through ages 1 to 46 with a history of 3 consecutive normal Pap tests.  Hepatitis C blood test.** / For any individual with known risks for hepatitis C.  Skin self-exam. / Monthly.  Influenza vaccine. / Every year.  Tetanus, diphtheria, and acellular pertussis (Tdap, Td) vaccine.** / Consult your health care provider. Pregnant women should receive 1 dose of Tdap vaccine during each pregnancy. 1 dose of Td every 10 years.  Varicella vaccine.** / Consult your health care provider. Pregnant females who do not have evidence of immunity should receive the first dose after pregnancy.  HPV vaccine. / 3 doses over 6 months, if 72 and younger. The vaccine is not recommended for use in pregnant females. However, pregnancy testing is not needed before receiving a dose.  Measles, mumps, rubella (MMR) vaccine.** / You need at least 1 dose of MMR if you were born in 1957 or later. You may also need a 2nd dose. For females of childbearing age, rubella immunity should be determined. If there is no evidence of immunity, females who are not pregnant should be vaccinated. If there is no evidence of immunity, females who are pregnant should delay immunization until after pregnancy.  Pneumococcal 13-valent conjugate (PCV13) vaccine.** / Consult your health care provider.  Pneumococcal polysaccharide (PPSV23) vaccine.** / 1 to 2 doses if you smoke cigarettes or if you have certain conditions.  Meningococcal vaccine.** /  1 dose if you are age 25 to 48 years and a [place] living in a residence hall, or have one of several medical conditions, you need to get vaccinated against meningococcal disease. You may also need additional booster doses.  Hepatitis A vaccine.** / Consult your health care provider.  Hepatitis B vaccine.** / Consult your health care provider.  Haemophilus influenzae type b (Hib) vaccine.** / Consult your health care provider. Ages 43 to 40 years  Blood pressure check.** / Every 1 to 2 years.  Lipid and cholesterol check.** / Every 5 years beginning at age 3 years.  Lung cancer screening. / Every year if you are aged 79-80 years and have a 30-pack-year history of smoking and currently smoke or have quit within the past 15 years. Yearly screening is stopped once you have quit smoking for at least 15 years or develop a health problem that would prevent you from having lung cancer treatment.  Clinical breast exam.** / Every year after age 63 years.  BRCA-related cancer risk assessment.** / For women who have family members with a BRCA-related cancer (breast, ovarian, tubal, or peritoneal cancers).  Mammogram.** / Every year beginning at age 26 years and continuing for as long as you are in good health. Consult with your health care provider.  Pap test.** / Every 3 years starting at age 77 years through age 62 or 53 years with a history of 3 consecutive normal Pap tests.  HPV screening.** / Every 3 years from ages 7 years through ages 62 to 87 years with a history of 3 consecutive normal Pap tests.  Fecal occult blood test (FOBT) of stool. / Every year beginning at age 87 years and continuing until age 49 years. You may not need to do this test if you get a colonoscopy every 10 years.  Flexible sigmoidoscopy or colonoscopy.** / Every 5 years for a flexible sigmoidoscopy or every 10 years for a colonoscopy beginning at age 62 years and continuing until age 62 years.  Hepatitis C blood test.** / For all people born from [birth year] through  1965 and any individual with known risks for hepatitis C.  Skin self-exam. / Monthly.  Influenza vaccine. / Every year.  Tetanus, diphtheria, and acellular pertussis (Tdap/Td) vaccine.** / Consult your health care provider. Pregnant women should receive 1 dose of Tdap vaccine during each pregnancy. 1 dose of Td every 10 years.  Varicella vaccine.** / Consult your health care provider. Pregnant females who do not have evidence of immunity should receive the first dose after pregnancy.  Zoster vaccine.** / 1 dose for adults aged 11 years or older.  Measles, mumps, rubella (MMR) vaccine.** / You need at least 1 dose of MMR if you were born in 1957 or later. You may also need a 2nd dose. For females of childbearing age, rubella immunity should be determined. If there is no evidence of immunity, females who are not pregnant should be vaccinated. If there is no evidence of immunity, females who are pregnant should delay immunization until after pregnancy.  Pneumococcal 13-valent conjugate (PCV13) vaccine.** / Consult your health care provider.  Pneumococcal polysaccharide (PPSV23) vaccine.** / 1 to 2 doses if you smoke cigarettes or if you have certain conditions.  Meningococcal vaccine.** / Consult your health care provider.  Hepatitis A vaccine.** / Consult your health care provider.  Hepatitis B vaccine.** / Consult your health care provider.  Haemophilus influenzae type b (Hib) vaccine.** / Consult your health care provider. Ages 71  years and over  Blood pressure check.** / Every 1 to 2 years.  Lipid and cholesterol check.** / Every 5 years beginning at age 20 years.  Lung cancer screening. / Every year if you are aged 55-80 years and have a 30-pack-year history of smoking and currently smoke or have quit within the past 15 years. Yearly screening is stopped once you have quit smoking for at least 15 years or develop a health problem that would prevent you from having lung cancer  treatment.  Clinical breast exam.** / Every year after age 40 years.  BRCA-related cancer risk assessment.** / For women who have family members with a BRCA-related cancer (breast, ovarian, tubal, or peritoneal cancers).  Mammogram.** / Every year beginning at age 40 years and continuing for as long as you are in good health. Consult with your health care provider.  Pap test.** / Every 3 years starting at age 30 years through age 65 or 70 years with 3 consecutive normal Pap tests. Testing can be stopped between 65 and 70 years with 3 consecutive normal Pap tests and no abnormal Pap or HPV tests in the past 10 years.  HPV screening.** / Every 3 years from ages 30 years through ages 65 or 70 years with a history of 3 consecutive normal Pap tests. Testing can be stopped between 65 and 70 years with 3 consecutive normal Pap tests and no abnormal Pap or HPV tests in the past 10 years.  Fecal occult blood test (FOBT) of stool. / Every year beginning at age 50 years and continuing until age 75 years. You may not need to do this test if you get a colonoscopy every 10 years.  Flexible sigmoidoscopy or colonoscopy.** / Every 5 years for a flexible sigmoidoscopy or every 10 years for a colonoscopy beginning at age 50 years and continuing until age 75 years.  Hepatitis C blood test.** / For all people born from 1945 through 1965 and any individual with known risks for hepatitis C.  Osteoporosis screening.** / A one-time screening for women ages 65 years and over and women at risk for fractures or osteoporosis.  Skin self-exam. / Monthly.  Influenza vaccine. / Every year.  Tetanus, diphtheria, and acellular pertussis (Tdap/Td) vaccine.** / 1 dose of Td every 10 years.  Varicella vaccine.** / Consult your health care provider.  Zoster vaccine.** / 1 dose for adults aged 60 years or older.  Pneumococcal 13-valent conjugate (PCV13) vaccine.** / Consult your health care provider.  Pneumococcal  polysaccharide (PPSV23) vaccine.** / 1 dose for all adults aged 65 years and older.  Meningococcal vaccine.** / Consult your health care provider.  Hepatitis A vaccine.** / Consult your health care provider.  Hepatitis B vaccine.** / Consult your health care provider.  Haemophilus influenzae type b (Hib) vaccine.** / Consult your health care provider. ** Family history and personal history of risk and conditions may change your health care provider's recommendations. Document Released: 04/15/2001 Document Revised: 07/04/2013 Document Reviewed: 07/15/2010 [employer] Patient Information 2015 [employer]. This information is not intended to replace advice given to you by your health care provider. Make sure you discuss any questions you have with your health care provider. Gluten-Free Diet for Celiac Disease Gluten is a protein found in wheat, rye, barley, and triticale (a cross between wheat and rye) grains. People with celiac disease need to have a gluten-free diet. With celiac disease, gluten interferes with the absorption of food and may also cause intestinal injury.  Strict compliance is important   even during symptom-free periods. This means eliminating all foods with gluten from your diet permanently. This requires some significant changes but is very manageable. WHAT DO I NEED TO KNOW ABOUT A GLUTEN-FREE DIET? Look for items labeled with "GF." Looking for GF will make it easier to identify products that are safe to eat. Read all labels. Gluten may have been added as a minor ingredient where least expected, such as in shredded cheeses or ice creams. Always check food labels and investigate questionable ingredients. Talk to your dietitian or health care provider if you have questions about certain foods or need help finding GF foods. Check when in doubt. If you are not sure whether an ingredient contains gluten, check with the manufacturer. Note that some manufacturers may change ingredients  without notice. Always read labels.  Know how food is prepared. Since flour and cereal products are often used in the preparation of foods, it is important to be aware of the methods of preparation used, as well as the ingredients in the foods themselves. This is especially true when you are dining out. Ask restaurants if they have a gluten-free menu. Watch for cross-contamination. Cross-contamination occurs when gluten-free foods come into contact with foods that contain gluten. It often happens during the manufacturing process. Always check the ingredient list and for warnings on packages, such as "may contain gluten." Eat a balanced diet. It is important to still get enough fiber, iron, and B vitamins in your diet. Look for enriched whole grain gluten-free products and continue to eat a well-balanced diet of the important non-grain items, such as vegetables, fruit, lean proteins, legumes, and dairy. Consider taking a gluten-free multivitamin and mineral supplement. Discuss this with your health care provider. WHAT KEY WORDS HELP IDENTIFY GLUTEN? Know key words to help identify gluten. A dietitian can help you identify possible harmful ingredients in the foods you normally eat. Words to check for on food labels include:  Flour, enriched flour, bromated flour, white flour, durum flour, graham flour, phosphated flour, self-rising flour, semolina, or farina. Starch, dextrin, modified food starch, or cereal. Thickening, fillers, or emulsifiers. Any kind of malt flavoring, extract, or syrup (malt is made from barley and includes malt vinegar, malted milk, and malted beverages). Hydrolyzed vegetable protein. WHAT FOODS CAN I EAT? Below is a list of common foods that are allowed with a gluten-free diet.  Grains Products made from the following flours or grains:amaranth,bean flours, 100% buckwheat flour, corn, millet, nut flours or meals, GF oats, quinoa, rice, sorghum, teff, any all-purpose 100% GF  flour mix, rice wafers, pure cornmeal tortillas, popcorn, some crackers, some chips, and hot cereals made from cornmeal. Ask your dietitian which specific hot and cold cereals are allowed. Hominy, rice or wild rice, and special GF pasta. Some Asian rice noodles or bean noodles. Arrowroot starch, corn bran, corn flour, corn germ, cornmeal, corn starch, potato flour, potato starch flour, and rice bran. Rice flours: plain, brown, and sweet. Rice polish, soy flour, tapioca starch. Vegetables All plain, fresh, frozen, or canned vegetables.  Fruits All fresh, frozen, canned, dried fruits, and fruit juices.  Meats and Other Protein Foods Meat, fish, poultry, or eggs prepared without added wheat, rye, barley, or triticale. Some luncheon meat and some frankfurters. Pure meat. All aged cheese, most processed cheese products, some cottage cheese, and some cream cheese. Dried beans, dried peas, and lentils.  Dairy Milk and yogurt made with allowed ingredients.  Beverages Coffee (regular or decaffeinated), tea, herbal tea (read label to be   sure that no wheat flour has been added). Carbonated beverages and some root beers. Wine, sake, and distilled spirits, such as gin, vodka, and whiskey. GF beers and GF ciders.  Sweetsand Desserts Sugar, honey, some syrups, molasses, jelly, jam, plain hard candy, marshmallows, gumdrops, homemade candies free of wheat, rye, barley, or triticale. Coconut. Custard, some pudding mixes, and homemade puddings from cornstarch, rice, and tapioca. Gelatin desserts, sorbets, frozen ice pops, and sherbet. Cake, cookies, and other desserts prepared with allowed flours. Some commercial ice creams. Ask your dietitian about specific brands of dessert that are allowed.  Fats and Oils Butter, margarine, vegetable oil, sour cream not containing modified food starch, whipping cream, shortening, lard, cream, and some mayonnaise. Some commercial salad dressings. Peanut butter.   Other Homemade broth and soups made with allowed ingredients; some canned or frozen soups. Any other combination or prepared foods that do not contain gluten. Monosodium glutamate (MSG). Cider, rice, and wine vinegar. Baking soda and baking powder. Certain soy sauces (Tamari). Ask your dietitian about specific brands that are allowed. Nuts, coconut, chocolate, and pure cocoa powder. Salt, pepper, herbs, spices, extracts, and food colorings. The items listed above may not be a complete list of allowed foods or beverages. Contact your dietitian for more options.  WHAT FOODS CAN I NOT EAT? Below is a list of common foods that are not allowed with a gluten-free diet.  Grains Barley, bran, bulgur, cracked wheat, graham, malt, matzo, wheat germ, and all wheat and rye cereals including spelt and kamut. Avoid cereals containing malt as a flavoring, such as rice cereal. Also avoid regular noodles, spaghetti, macaroni, and most packaged rice mixes, and all others containing wheat, rye, barley, or triticale.  Vegetables Most creamed vegetables, most vegetables canned in sauces, and any vegetables prepared with wheat, rye, barley, or triticale.  Fruits Thickened or prepared fruits and some pie fillings.  Meats and Other Protein Sources Any meat or meat alternative containing wheat, rye, barley, or gluten stabilizers (such as some hot dogs, salami, cold cuts, or sausage). Bread-containing products, such as Swiss steak, croquettes, and meatloaf. Most tuna canned in vegetable broth, turkey with hydrolyzed vegetable protein (HVP) injected as part of the basting, and any cheese product containing oat gum as an ingredient. Seitan. Imitation fish. Dairy Commercial chocolate milk, which may have cereal added, and malted milk. Beverages Certain cereal beverages. Beer and ciders (unless GF), ale, malted milk, and some root beers. Sweetsand Desserts Commercial candies containing wheat, rye, barley, or triticale.  Certain toffees are dusted with wheat flour. Chocolate-coated nuts, which are often rolled in flour. Cakes, cookies, doughnuts, and pastries that are prepared with wheat, barley, rye, or triticale flour. Some commercial ice creams, ice cream flavors which contain cookies, crumbs, or cheesecake. Ice cream cones. Commercially prepared mixes for cakes, cookies, and other desserts unless marked GF. Bread pudding and other puddings thickened with flour. Fats and Oils Some commercial salad dressings and sour cream containing modified food starch.  Condiments Some curry powder, some dry seasoning mixes, some gravy extracts, some meat sauces, some ketchup, some prepared mustard, horseradish. Other All soups containing wheat, rye, barley, or triticale flour. Bouillon and bouillon cubes that contain HVP. Combination or prepared foods that contain gluten. Some soy sauce, some chip dips, and some chewing gum. Yeast extract (contains barley). Caramel color (may contain malt). The items listed above may not be a complete list of foods and beverages to avoid. Contact your dietitian for more information. Document Released: 02/17/2005 Document Revised:   07/04/2013 Document Reviewed: 12/22/2012 [employer] Patient Information 2015 [employer]. This information is not intended to replace advice given to you by your health care provider. Make sure you discuss any questions you have with your health care provider. Abnormal Uterine Bleeding Abnormal uterine bleeding can affect women at various stages in life, including teenagers, women in their reproductive years, pregnant women, and women who have reached menopause. Several kinds of uterine bleeding are considered abnormal, including: Bleeding or spotting between periods.  Bleeding after sexual intercourse.  Bleeding that is heavier or more than normal.  Periods that last longer than usual. Bleeding after menopause.  Many cases of abnormal uterine bleeding are  minor and simple to treat, while others are more serious. Any type of abnormal bleeding should be evaluated by your health care provider. Treatment will depend on the cause of the bleeding. HOME CARE INSTRUCTIONS Monitor your condition for any changes. The following actions may help to alleviate any discomfort you are experiencing: Avoid the use of tampons and douches as directed by your health care provider. Change your pads frequently. You should get regular pelvic exams and Pap tests. Keep all follow-up appointments for diagnostic tests as directed by your health care provider.  SEEK MEDICAL CARE IF:  Your bleeding lasts more than 1 week.  You feel dizzy at times.  SEEK IMMEDIATE MEDICAL CARE IF:  You pass out.  You are changing pads every 15 to 30 minutes.  You have abdominal pain. You have a fever.  You become sweaty or weak.  You are passing large blood clots from the vagina.  You start to feel nauseous and vomit. MAKE SURE YOU:  Understand these instructions. Will watch your condition. Will get help right away if you are not doing well or get worse. Document Released: 02/17/2005 Document Revised: 02/22/2013 Document Reviewed: 09/16/2012 [employer] Patient Information 2015 [employer]. This information is not intended to replace advice given to you by your health care provider. Make sure you discuss any questions you have with your health care provider.  

## 2014-08-07 NOTE — Progress Notes (Signed)
  Subjective:     Bianca BatemanSuzanne L Mazzaferro is a 44 y.o. female and is here for a comprehensive physical exam. The patient reports problems - Intermittent heavier, prolonged cycles. also, reports RLQ pain, related to chronic constipation.  Did better with gluten free and lactose free diet, looking to resume this. She had to change jobs this year. She has had one normal mammogram 2 years ago.  History   Social History  . Marital Status: Married    Spouse Name: N/A  . Number of Children: N/A  . Years of Education: N/A   Occupational History  . Not on file.   Social History Main Topics  . Smoking status: Former Smoker    Quit date: 12/01/2005  . Smokeless tobacco: Not on file  . Alcohol Use: Yes     Comment: 3 drinks a week.  . Drug Use: No  . Sexual Activity:    Partners: Male    Birth Control/ Protection: Coitus interruptus   Other Topics Concern  . Not on file   Social History Narrative   Health Maintenance  Topic Date Due  . HIV Screening  02/12/1987  . TETANUS/TDAP  02/12/1991  . INFLUENZA VACCINE  10/02/2014  . PAP SMEAR  05/28/2015    The following portions of the patient's history were reviewed and updated as appropriate: allergies, current medications, past family history, past medical history, past social history, past surgical history and problem list.  Review of Systems A comprehensive review of systems was negative. Except as in the HPI.  Objective:    BP 112/74 mmHg  Pulse 71  Ht 5\' 7"  (1.702 m)  Wt 149 lb (67.586 kg)  BMI 23.33 kg/m2  LMP 07/24/2014 General appearance: alert, cooperative and appears stated age Head: Normocephalic, without obvious abnormality, atraumatic Neck: no adenopathy, supple, symmetrical, trachea midline and thyroid not enlarged, symmetric, no tenderness/mass/nodules Lungs: clear to auscultation bilaterally Breasts: normal appearance, no masses or tenderness Heart: regular rate and rhythm, S1, S2 normal, no murmur, click, rub or  gallop Abdomen: soft, non-tender; bowel sounds normal; no masses,  no organomegaly Pelvic: cervix normal in appearance, external genitalia normal, no adnexal masses or tenderness, no cervical motion tenderness, uterus normal size, shape, and consistency and vagina normal without discharge Extremities: extremities normal, atraumatic, no cyanosis or edema Pulses: 2+ and symmetric Skin: Skin color, texture, turgor normal. No rashes or lesions Lymph nodes: Cervical, supraclavicular, and axillary nodes normal. Neurologic: Grossly normal    Assessment:    Healthy female exam.      Plan:      Problem List Items Addressed This Visit    None    Visit Diagnoses    Encounter for routine gynecological examination    -  Primary    Relevant Orders    Cytology - PAP    CBC    TSH    Comprehensive metabolic panel    Lipid panel    Screening for malignant neoplasm of cervix        Relevant Orders    Cytology - PAP    NCGS (non-celiac gluten sensitivity)        Relevant Orders    Gliadin IgA+tTG IgA    Abnormal uterine bleeding        Relevant Orders    US Pelvis Complete    US Transvaginal Non-OB     Declines screening mammography this year.  See After Visit Summary for Counseling Recommendations

## 2014-08-08 LAB — COMPREHENSIVE METABOLIC PANEL
ALT: 16 U/L (ref 0–35)
AST: 18 U/L (ref 0–37)
Albumin: 4.6 g/dL (ref 3.5–5.2)
Alkaline Phosphatase: 38 U/L — ABNORMAL LOW (ref 39–117)
BUN: 13 mg/dL (ref 6–23)
CO2: 27 mEq/L (ref 19–32)
Calcium: 9.5 mg/dL (ref 8.4–10.5)
Chloride: 100 mEq/L (ref 96–112)
Creat: 0.89 mg/dL (ref 0.50–1.10)
Glucose, Bld: 82 mg/dL (ref 70–99)
Potassium: 4.7 mEq/L (ref 3.5–5.3)
Sodium: 137 mEq/L (ref 135–145)
Total Bilirubin: 0.7 mg/dL (ref 0.2–1.2)
Total Protein: 6.8 g/dL (ref 6.0–8.3)

## 2014-08-08 LAB — LIPID PANEL
Cholesterol: 204 mg/dL — ABNORMAL HIGH (ref 0–200)
HDL: 76 mg/dL (ref >=46)
LDL Cholesterol: 113 mg/dL — ABNORMAL HIGH (ref 0–99)
Total CHOL/HDL Ratio: 2.7 Ratio
Triglycerides: 76 mg/dL (ref <150)
VLDL: 15 mg/dL (ref 0–40)

## 2014-08-08 LAB — CBC
HCT: 40.1 % (ref 36.0–46.0)
Hemoglobin: 13.7 g/dL (ref 12.0–15.0)
MCH: 31.5 pg (ref 26.0–34.0)
MCHC: 34.2 g/dL (ref 30.0–36.0)
MCV: 92.2 fL (ref 78.0–100.0)
MPV: 9.7 fL (ref 8.6–12.4)
Platelets: 238 10*3/uL (ref 150–400)
RBC: 4.35 MIL/uL (ref 3.87–5.11)
RDW: 13.1 % (ref 11.5–15.5)
WBC: 7.7 10*3/uL (ref 4.0–10.5)

## 2014-08-08 LAB — GLIADIN IGA+TTG IGA: Transglutaminase IgA: <2 U/mL (ref 0–3)

## 2014-08-08 LAB — TSH: TSH: 2.873 u[IU]/mL (ref 0.350–4.500)

## 2014-08-09 LAB — CYTOLOGY - PAP

## 2014-08-14 ENCOUNTER — Ambulatory Visit (HOSPITAL_COMMUNITY)
Admission: RE | Admit: 2014-08-14 | Discharge: 2014-08-14 | Disposition: A | Discharge status: Home / Self Care | Payer: BC Managed Care – PPO | Source: Ambulatory Visit | Attending: Family Medicine | Admitting: Family Medicine

## 2014-08-14 ENCOUNTER — Ambulatory Visit (HOSPITAL_COMMUNITY): Payer: BC Managed Care – PPO

## 2014-08-14 DIAGNOSIS — N939 Abnormal uterine and vaginal bleeding, unspecified: Secondary | ICD-10-CM | POA: Diagnosis present

## 2015-09-07 ENCOUNTER — Encounter: Payer: Self-pay | Admitting: Gynecology

## 2015-09-07 ENCOUNTER — Ambulatory Visit (INDEPENDENT_AMBULATORY_CARE_PROVIDER_SITE_OTHER): Payer: BC Managed Care – PPO | Admitting: Gynecology

## 2015-09-07 VITALS — BP 110/78 | Ht 67.0 in | Wt 135.0 lb

## 2015-09-07 DIAGNOSIS — N83201 Unspecified ovarian cyst, right side: Secondary | ICD-10-CM | POA: Diagnosis not present

## 2015-09-07 DIAGNOSIS — Z01419 Encounter for gynecological examination (general) (routine) without abnormal findings: Secondary | ICD-10-CM

## 2015-09-07 DIAGNOSIS — Z1329 Encounter for screening for other suspected endocrine disorder: Secondary | ICD-10-CM | POA: Diagnosis not present

## 2015-09-07 DIAGNOSIS — Z13220 Encounter for screening for lipoid disorders: Secondary | ICD-10-CM | POA: Diagnosis not present

## 2015-09-07 DIAGNOSIS — N926 Irregular menstruation, unspecified: Secondary | ICD-10-CM

## 2015-09-07 NOTE — Patient Instructions (Signed)
Follow up for blood work and ultrasound as scheduled.  You may obtain a copy of any labs that were done today by logging onto MyChart as outlined in the instructions provided with your AVS (after visit summary). The office will not call with normal lab results but certainly if there are any significant abnormalities then we will contact you.   Health Maintenance Adopting a healthy lifestyle and getting preventive care can go a long way to promote health and wellness. Talk with your health care provider about what schedule of regular examinations is right for you. This is a good chance for you to check in with your provider about disease prevention and staying healthy. In between checkups, there are plenty of things you can do on your own. Experts have done a lot of research about which lifestyle changes and preventive measures are most likely to keep you healthy. Ask your health care provider for more information. WEIGHT AND DIET  Eat a healthy diet  Be sure to include plenty of vegetables, fruits, low-fat dairy products, and lean protein.  Do not eat a lot of foods high in solid fats, added sugars, or salt.  Get regular exercise. This is one of the most important things you can do for your health.  Most adults should exercise for at least 150 minutes each week. The exercise should increase your heart rate and make you sweat (moderate-intensity exercise).  Most adults should also do strengthening exercises at least twice a week. This is in addition to the moderate-intensity exercise.  Maintain a healthy weight  Body mass index (BMI) is a measurement that can be used to identify possible weight problems. It estimates body fat based on height and weight. Your health care provider can help determine your BMI and help you achieve or maintain a healthy weight.  For females 6 years of age and older:   A BMI below 18.5 is considered underweight.  A BMI of 18.5 to 24.9 is normal.  A BMI of 25  to 29.9 is considered overweight.  A BMI of 30 and above is considered obese.  Watch levels of cholesterol and blood lipids  You should start having your blood tested for lipids and cholesterol at 44 years of age, then have this test every 5 years.  You may need to have your cholesterol levels checked more often if:  Your lipid or cholesterol levels are high.  You are older than 45 years of age.  You are at high risk for heart disease.  CANCER SCREENING   Lung Cancer  Lung cancer screening is recommended for adults 64-74 years old who are at high risk for lung cancer because of a history of smoking.  A yearly low-dose CT scan of the lungs is recommended for people who:  Currently smoke.  Have quit within the past 15 years.  Have at least a 30-pack-year history of smoking. A pack year is smoking an average of one pack of cigarettes a day for 1 year.  Yearly screening should continue until it has been 15 years since you quit.  Yearly screening should stop if you develop a health problem that would prevent you from having lung cancer treatment.  Breast Cancer  Practice breast self-awareness. This means understanding how your breasts normally appear and feel.  It also means doing regular breast self-exams. Let your health care provider know about any changes, no matter how small.  If you are in your 20s or 30s, you should have a clinical  breast exam (CBE) by a health care provider every 1-3 years as part of a regular health exam.  If you are 40 or older, have a CBE every year. Also consider having a breast X-ray (mammogram) every year.  If you have a family history of breast cancer, talk to your health care provider about genetic screening.  If you are at high risk for breast cancer, talk to your health care provider about having an MRI and a mammogram every year.  Breast cancer gene (BRCA) assessment is recommended for women who have family members with BRCA-related  cancers. BRCA-related cancers include:  Breast.  Ovarian.  Tubal.  Peritoneal cancers.  Results of the assessment will determine the need for genetic counseling and BRCA1 and BRCA2 testing. Cervical Cancer Routine pelvic examinations to screen for cervical cancer are no longer recommended for nonpregnant women who are considered low risk for cancer of the pelvic organs (ovaries, uterus, and vagina) and who do not have symptoms. A pelvic examination may be necessary if you have symptoms including those associated with pelvic infections. Ask your health care provider if a screening pelvic exam is right for you.   The Pap test is the screening test for cervical cancer for women who are considered at risk.  If you had a hysterectomy for a problem that was not cancer or a condition that could lead to cancer, then you no longer need Pap tests.  If you are older than 65 years, and you have had normal Pap tests for the past 10 years, you no longer need to have Pap tests.  If you have had past treatment for cervical cancer or a condition that could lead to cancer, you need Pap tests and screening for cancer for at least 20 years after your treatment.  If you no longer get a Pap test, assess your risk factors if they change (such as having a new sexual partner). This can affect whether you should start being screened again.  Some women have medical problems that increase their chance of getting cervical cancer. If this is the case for you, your health care provider may recommend more frequent screening and Pap tests.  The human papillomavirus (HPV) test is another test that may be used for cervical cancer screening. The HPV test looks for the virus that can cause cell changes in the cervix. The cells collected during the Pap test can be tested for HPV.  The HPV test can be used to screen women 30 years of age and older. Getting tested for HPV can extend the interval between normal Pap tests from  three to five years.  An HPV test also should be used to screen women of any age who have unclear Pap test results.  After 44 years of age, women should have HPV testing as often as Pap tests.  Colorectal Cancer  This type of cancer can be detected and often prevented.  Routine colorectal cancer screening usually begins at 44 years of age and continues through 44 years of age.  Your health care provider may recommend screening at an earlier age if you have risk factors for colon cancer.  Your health care provider may also recommend using home test kits to check for hidden blood in the stool.  A small camera at the end of a tube can be used to examine your colon directly (sigmoidoscopy or colonoscopy). This is done to check for the earliest forms of colorectal cancer.  Routine screening usually begins at age   50.  Direct examination of the colon should be repeated every 5-10 years through 44 years of age. However, you may need to be screened more often if early forms of precancerous polyps or small growths are found. Skin Cancer  Check your skin from head to toe regularly.  Tell your health care provider about any new moles or changes in moles, especially if there is a change in a mole's shape or color.  Also tell your health care provider if you have a mole that is larger than the size of a pencil eraser.  Always use sunscreen. Apply sunscreen liberally and repeatedly throughout the day.  Protect yourself by wearing long sleeves, pants, a wide-brimmed hat, and sunglasses whenever you are outside. HEART DISEASE, DIABETES, AND HIGH BLOOD PRESSURE   Have your blood pressure checked at least every 1-2 years. High blood pressure causes heart disease and increases the risk of stroke.  If you are between 70 years and 59 years old, ask your health care provider if you should take aspirin to prevent strokes.  Have regular diabetes screenings. This involves taking a blood sample to check  your fasting blood sugar level.  If you are at a normal weight and have a low risk for diabetes, have this test once every three years after 44 years of age.  If you are overweight and have a high risk for diabetes, consider being tested at a younger age or more often. PREVENTING INFECTION  Hepatitis B  If you have a higher risk for hepatitis B, you should be screened for this virus. You are considered at high risk for hepatitis B if:  You were born in a country where hepatitis B is common. Ask your health care provider which countries are considered high risk.  Your parents were born in a high-risk country, and you have not been immunized against hepatitis B (hepatitis B vaccine).  You have HIV or AIDS.  You use needles to inject street drugs.  You live with someone who has hepatitis B.  You have had sex with someone who has hepatitis B.  You get hemodialysis treatment.  You take certain medicines for conditions, including cancer, organ transplantation, and autoimmune conditions. Hepatitis C  Blood testing is recommended for:  Everyone born from [birth year] through 1964.  Anyone with known risk factors for hepatitis C. Sexually transmitted infections (STIs)  You should be screened for sexually transmitted infections (STIs) including gonorrhea and chlamydia if:  You are sexually active and are younger than 44 years of age.  You are older than 44 years of age and your health care provider tells you that you are at risk for this type of infection.  Your sexual activity has changed since you were last screened and you are at an increased risk for chlamydia or gonorrhea. Ask your health care provider if you are at risk.  If you do not have HIV, but are at risk, it may be recommended that you take a prescription medicine daily to prevent HIV infection. This is called pre-exposure prophylaxis (PrEP). You are considered at risk if:  You are sexually active and do not regularly use  condoms or know the HIV status of your partner(s).  You take drugs by injection.  You are sexually active with a partner who has HIV. Talk with your health care provider about whether you are at high risk of being infected with HIV. If you choose to begin PrEP, you should first be tested for HIV. You should  then be tested every 3 months for as long as you are taking PrEP.  PREGNANCY   If you are premenopausal and you may become pregnant, ask your health care provider about preconception counseling.  If you may become pregnant, take 400 to 800 micrograms (mcg) of folic acid every day.  If you want to prevent pregnancy, talk to your health care provider about birth control (contraception). OSTEOPOROSIS AND MENOPAUSE   Osteoporosis is a disease in which the bones lose minerals and strength with aging. This can result in serious bone fractures. Your risk for osteoporosis can be identified using a bone density scan.  If you are 53 years of age or older, or if you are at risk for osteoporosis and fractures, ask your health care provider if you should be screened.  Ask your health care provider whether you should take a calcium or vitamin D supplement to lower your risk for osteoporosis.  Menopause may have certain physical symptoms and risks.  Hormone replacement therapy may reduce some of these symptoms and risks. Talk to your health care provider about whether hormone replacement therapy is right for you.  HOME CARE INSTRUCTIONS   Schedule regular health, dental, and eye exams.  Stay current with your immunizations.   Do not use any tobacco products including cigarettes, chewing tobacco, or electronic cigarettes.  If you are pregnant, do not drink alcohol.  If you are breastfeeding, limit how much and how often you drink alcohol.  Limit alcohol intake to no more than 1 drink per day for nonpregnant women. One drink equals 12 ounces of beer, 5 ounces of wine, or 1 ounces of hard  liquor.  Do not use street drugs.  Do not share needles.  Ask your health care provider for help if you need support or information about quitting drugs.  Tell your health care provider if you often feel depressed.  Tell your health care provider if you have ever been abused or do not feel safe at home. Document Released: 09/02/2010 Document Revised: 07/04/2013 Document Reviewed: 01/19/2013 [facility] Patient Information 2015 [place], [place]. This information is not intended to replace advice given to you by your health care provider. Make sure you discuss any questions you have with your health care provider.

## 2015-09-07 NOTE — Progress Notes (Signed)
Bianca Walton 18-Feb-1971 [id number]        45 y.o.  G2P1011 new patient for annual exam.  Several issues noted below.  Past medical history,surgical history, problem list, medications, allergies, family history and social history were all reviewed and documented as reviewed in the EPIC chart.  ROS:  Performed with pertinent positives and negatives included in the history, assessment and plan.   Additional significant findings :  None   Exam: Del[facility] Serve Vitals:   09/07/15 1450  BP: 110/78  Height: 5\' 7"  (1.702 m)  Weight: 135 lb (61.236 kg)   General appearance:  Normal affect, orientation and appearance. Skin: Grossly normal HEENT: Without gross lesions.  No cervical or supraclavicular adenopathy. Thyroid normal.  Lungs:  Clear without wheezing, rales or rhonchi Cardiac: RR, without RMG Abdominal:  Soft, nontender, without masses, guarding, rebound, organomegaly or hernia Breasts:  Examined lying and sitting without masses, retractions, discharge or axillary adenopathy. Pelvic:  Ext/BUS/Vagina Normal  Cervix normal  Uterus axial, normal size, shape and contour, midline and mobile nontender   Adnexa without masses or tenderness    Anus and perineum normal   Rectovaginal normal sphincter tone without palpated masses or tenderness.    Assessment/Plan:  45 y.o. G77P1011 female for annual exam with irregular menses, coitus interruptus birth control.   1. Irregular menses. Patient notes over the last several years her periods have become progressively more irregular where she will have menses that will go anywhere from once a month to every 6 weeks. Sometimes bleeding twice monthly. Some menses light other menses lasting 7 days. Also having some episodes of breakthrough bleeding in between. Ultrasound last year showed endometrium at 6 mm. Recommend baseline labs to include TSH FSH prolactin. Patient going to have drawn at [facility] and written prescription given.  Start with sonohysterogram to rule out intracavitary abnormalities. Options for management depending on results discussed. Possible menstrual regulation such as low-dose oral contraceptives or Mirena IUD discussed. Will follow up discuss after lab results and ultrasound. 2. 5 cm simple right ovarian cyst on ultrasound last year. No mention as to vascular flow. Will follow up for sonohysterogram and relook at the ovaries at that time. No palpable abnormalities on exam today. 3. Mammography today. Continue with annual mammography when due. SBE monthly reviewed. 4. Pap smear/HPV 2016. No Pap smear done today. No history of significant abnormal Pap smears. Plan repeat Pap smear approaching 5 year interval per current screening guidelines. 5. Health maintenance. Patient requests baseline labs. CBC, CMP, lipid profile, urinalysis ordered with above lab work at lab core. Patients can follow up fasting to have that drawn. Follow up for ultrasound. Follow up in one year for annual exam.   [name] MD, 3:28 PM 09/07/2015

## 2015-09-08 LAB — URINALYSIS W MICROSCOPIC + REFLEX CULTURE
Bacteria, UA: NONE SEEN [HPF]
Bilirubin Urine: NEGATIVE
Casts: NONE SEEN [LPF]
Crystals: NONE SEEN [HPF]
Glucose, UA: NEGATIVE
Hgb urine dipstick: NEGATIVE
Ketones, ur: NEGATIVE
Nitrite: NEGATIVE
Protein, ur: NEGATIVE
RBC / HPF: NONE SEEN RBC/HPF (ref <=2)
Specific Gravity, Urine: 1.013 (ref 1.001–1.035)
Yeast: NONE SEEN [HPF]
pH: 7.5 (ref 5.0–8.0)

## 2015-09-09 LAB — URINE CULTURE
Colony Count: NO GROWTH
Organism ID, Bacteria: NO GROWTH

## 2015-09-11 ENCOUNTER — Other Ambulatory Visit: Payer: Self-pay | Admitting: Gynecology

## 2015-09-11 DIAGNOSIS — N83201 Unspecified ovarian cyst, right side: Secondary | ICD-10-CM

## 2015-09-11 DIAGNOSIS — N939 Abnormal uterine and vaginal bleeding, unspecified: Secondary | ICD-10-CM

## 2015-09-27 ENCOUNTER — Encounter: Payer: Self-pay | Admitting: Gynecology

## 2015-09-27 ENCOUNTER — Other Ambulatory Visit: Payer: BC Managed Care – PPO

## 2015-09-27 ENCOUNTER — Ambulatory Visit: Payer: BC Managed Care – PPO | Admitting: Gynecology

## 2015-12-12 ENCOUNTER — Telehealth: Payer: Self-pay | Admitting: *Deleted

## 2015-12-12 NOTE — Telephone Encounter (Signed)
Per Ace at [facility] ref call # [phone] [facility] codes [phone], [id number], [id number], [phone]  Covered at 85%/15% coinsurance after [place] of $1500 is met which $588.99 is met leaving $1211.01  Therefore pt responsibility is $1126.62 which is the allowables for her insurance. KW   Pt will be advised. KW CMA

## 2015-12-20 ENCOUNTER — Ambulatory Visit: Payer: BC Managed Care – PPO | Admitting: Gynecology

## 2015-12-20 ENCOUNTER — Other Ambulatory Visit: Payer: BC Managed Care – PPO

## 2015-12-31 ENCOUNTER — Encounter: Payer: Self-pay | Admitting: Gynecology

## 2016-01-07 ENCOUNTER — Telehealth: Payer: Self-pay

## 2016-01-07 ENCOUNTER — Encounter: Payer: Self-pay | Admitting: Gynecology

## 2016-01-07 ENCOUNTER — Ambulatory Visit (INDEPENDENT_AMBULATORY_CARE_PROVIDER_SITE_OTHER): Payer: BC Managed Care – PPO | Admitting: Gynecology

## 2016-01-07 ENCOUNTER — Ambulatory Visit (INDEPENDENT_AMBULATORY_CARE_PROVIDER_SITE_OTHER): Payer: BC Managed Care – PPO

## 2016-01-07 ENCOUNTER — Other Ambulatory Visit: Payer: Self-pay | Admitting: Gynecology

## 2016-01-07 VITALS — BP 118/74

## 2016-01-07 DIAGNOSIS — N939 Abnormal uterine and vaginal bleeding, unspecified: Secondary | ICD-10-CM

## 2016-01-07 DIAGNOSIS — N926 Irregular menstruation, unspecified: Secondary | ICD-10-CM

## 2016-01-07 DIAGNOSIS — N839 Noninflammatory disorder of ovary, fallopian tube and broad ligament, unspecified: Secondary | ICD-10-CM

## 2016-01-07 DIAGNOSIS — N838 Other noninflammatory disorders of ovary, fallopian tube and broad ligament: Secondary | ICD-10-CM

## 2016-01-07 DIAGNOSIS — N83201 Unspecified ovarian cyst, right side: Secondary | ICD-10-CM

## 2016-01-07 DIAGNOSIS — N951 Menopausal and female climacteric states: Secondary | ICD-10-CM

## 2016-01-07 NOTE — Progress Notes (Addendum)
Bianca Walton [birth year] [id number]        45 y.o.  G2P1011 presents for sonohysterogram. Was initially seen in July with a history of irregular bleeding. I ordered the sonohysterogram a TSH FSH prolactin. Patient has just followed up for now she was very busy previously and did not want to call and arrange. She just had her blood work drawn last week at [facility] and I do not have a copy of these results yet. She notes that her periods have continued to be irregular with her last menstrual period was September 14. She does note the onset of some hot flashes and sweats. No skin hair or weight changes. She denies pregnancy possibility.  Past medical history,surgical history, problem list, medications, allergies, family history and social history were all reviewed and documented in the EPIC chart.  Directed ROS with pertinent positives and negatives documented in the history of present illness/assessment and plan.  Exam: [place] assistant Vitals:   01/07/16 1240  BP: 118/74   General appearance:  Normal Abdomen soft nontender without masses guarding rebound Pelvic external BUS vagina normal. Cervix normal. Uterus grossly normal size midline mobile nontender. Adnexa without gross masses or tenderness.  Ultrasound transvaginal and transabdominal. Uterus grossly normal size and echotexture. Endometrial echo 1.5 mm. Right ovary with avascular 48 mm mean echo-free cyst. Left ovary normal. Cul-de-sac negative.  Sonohysterogram performed, sterile technique, single tooth tenaculum anterior lip stabilization, easy catheter introduction, good distention with no abnormalities. Endometrial sample taken with scant return consistent with thin endometrial measurement.  Assessment/Plan:  45 y.o. G2P1011 with:  1. Irregular menses and some menopausal symptoms. Will follow up with her FSH prolactin TSH results and will discuss. I reviewed the possibility of premature menopause. If so the issues of HRT  was discussed with her impossible benefit from a cardiovascular/bone health standpoint when started in a premature menopause patient. The benefits also of symptom relief discussed. The risks to include the WHI study and the latest NAMS 2017 guideline review to include increased risk of stroke heart attack DVT and breast cancer also discussed. Various options and forms of replacement discussed. Possible low-dose oral contraceptives from a irregular bleeding management as well as symptom relief also discussed. She does have a history of smoking but stopped a number of years ago. Possible increased risk of thrombosis with this history discussed. Patient said she is not overly bothered by the hot flushes and will think about whether she would consider HRT or not. Sonohysterogram does not show any significant cavitary abnormalities. Will follow up for biopsy results. Suspect may return scant or inadequate given the thin endometrium. 2. Persistent right ovarian cyst. Had ultrasound 08/2014 which showed a 5.3 cm simple right ovarian cyst. This obviously has persisted although measures possibly smaller/stable. All indicators point towards a benign etiology given stability of size, simple nature and avascular character. No guarantees was discussed with the patient in absence of removing and sending to pathology. Options for continued observation and she appears to be asymptomatic without pain. Questionable contribution to her irregular bleeding was discussed. Alternative would be to proceed with laparoscopic cystectomy. Was involved with this and general was reviewed. At this point patient not wanting intervention. I did recommend baseline CA-125 for now she'll go ahead and have that drawn today.[name] MD, 12:58 PM 01/07/2016

## 2016-01-07 NOTE — Patient Instructions (Signed)
Office will call you with lab results and the biopsy results.

## 2016-01-07 NOTE — Telephone Encounter (Signed)
[facility] "Patient called stating she does want to proceed with laparoscopic cystectomy. "

## 2016-01-08 ENCOUNTER — Telehealth: Payer: Self-pay

## 2016-01-08 NOTE — Telephone Encounter (Signed)
Left message for patient to call me

## 2016-01-09 ENCOUNTER — Telehealth: Payer: Self-pay

## 2016-01-09 NOTE — Telephone Encounter (Signed)
I spoke with patient yesterday and we discussed her ins benefits and estimated surgery prepymt amount.  Our available date before Jan 1 is 01/22/16.  She wanted this date but asked if cancellation sooner to let her know.

## 2016-01-10 NOTE — Telephone Encounter (Signed)
Not needed

## 2016-01-11 ENCOUNTER — Encounter: Payer: Self-pay | Admitting: Gynecology

## 2016-01-11 ENCOUNTER — Ambulatory Visit (INDEPENDENT_AMBULATORY_CARE_PROVIDER_SITE_OTHER): Payer: BC Managed Care – PPO | Admitting: Gynecology

## 2016-01-11 VITALS — BP 118/76

## 2016-01-11 DIAGNOSIS — E2839 Other primary ovarian failure: Secondary | ICD-10-CM

## 2016-01-11 DIAGNOSIS — N83201 Unspecified ovarian cyst, right side: Secondary | ICD-10-CM | POA: Diagnosis not present

## 2016-01-11 DIAGNOSIS — R102 Pelvic and perineal pain: Secondary | ICD-10-CM

## 2016-01-11 DIAGNOSIS — E288 Other ovarian dysfunction: Secondary | ICD-10-CM | POA: Diagnosis not present

## 2016-01-11 NOTE — Patient Instructions (Signed)
Follow up for surgery as scheduled. Follow up if you have any further questions.

## 2016-01-11 NOTE — Progress Notes (Signed)
Bianca BatemanSuzanne L Walton Nov 15, 1970 [id number]        45 y.o.  G2P1011 presents our consultation in reference to surgery.  Patient has a persistent right ovarian avascular simple cyst measuring 48 mm present for at least a year and a half. She is now having discomfort with it. CA-125 returned 9. Also having some irregular bleeding and hot flushes. FSH returned elevated. TSH and prolactin were normal.  Patient had sonohysterogram 01/07/2016 which showed an empty cavity with endometrial biopsy showing benign endometrium.   Past medical history,surgical history, problem list, medications, allergies, family history and social history were all reviewed and documented in the EPIC chart.  Directed ROS with pertinent positives and negatives documented in the history of present illness/assessment and plan.  Exam: Vitals:   01/11/16 1155  BP: 118/76   General appearance:  Normal HEENT normal Lungs clear bilaterally Cardiac regular rate no rubs murmurs or gallops Abdomen soft nontender without masses guarding rebound Pelvic exam 01/07/2016 External BUS vagina normal. Cervix normal. Uterus grossly normal size midline mobile nontender. Adnexa without gross masses or tenderness  Assessment/Plan:  45 y.o. G2P1011 with persistent right ovarian cyst causing her discomfort as an aching to sharp stabbing right pelvic pain. Appears benign on ultrasound without concerning changes and has remained stable over the last year and a half since her prior measurement. Normal CA-125. Reviewed with patient most likely a benign tumor such as serous cystadenoma. Patient does understand though I cannot guarantee pathology and it may represent a borderline or frankly malignant ovarian cancer. If so then referral to a gynecologic oncologist for definitive surgery would be made. She is comfortable not being seen by a gynecologic oncologist at this time. She also is having menopausal symptoms with some irregular bleeding and hot flushes  with elevated FSH. 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 The issues as to whether we should proceed with a prophylactic salpingo-oophorectomy bilateral since we are already doing surgery and then plan on HRT afterwards was discussed. I reviewed with her that with premature ovarian failure she may or may not regain ovarian function in the future as a poorly understood phenomenon. Also reviewed whether, even though her ovaries are not producing sufficient amounts of hormones to prevent symptoms they are producing hormones that may be beneficial to her and that this may go on for some time. We discussed weighing the risks/benefits of keeping her ovaries for whatever future hormonal production they have clearly recognizing we do not have a crystal ball and that we may end up putting her on HRT regardless and she would maintain the risk of ovarian cancer or future problems requiring intervention. We also reviewed the issues of removing her ovaries in a 45 year old female even though they appear to be failing at this time not knowing what the future will hold. Lastly I discussed the issues of contraception and whether she would want to proceed with tubal sterilization/salpingectomies as a risk reductive surgery if she did decide to keep her ovaries. After a very lengthy discussion the patient would prefer to proceed with a right ovarian cystectomy possible right salpingo-oophorectomy if as an intraoperative decision removing the cyst alone would leave little viable tissue or for other technical reasons. Regardless she wants to retain ovarian function bilaterally at this point as best she can and does not want her ovaries removed. She does not want prophylactic salpingectomies or tubal sterilization as she states that she would accept a pregnancy at this point if it  occurred. The expected intraoperative and postoperative courses as well as the recovery period were reviewed. The risks of infection,  prolonged antibiotics, reoperation for abscess or hematoma formation was discussed. The risks of hemorrhage necessitating transfusion and the risks of transfusion reaction, hepatitis, HIV, mad cow disease and other unknown entities was also discussed. Incisional complications to include opening and draining of incisions and closure by secondary intention, dehiscence and long-term issues of keloid/cosmetics and hernia formation were reviewed. The risk of inadvertent injury to internal organs including bowel, bladder, ureters, vessels, nerves either immediately recognized or delay recognized necessitating major exploratory reparative surgeries and future reparative surgeries including bowel resection, ostomy formation, bladder repair, ureteral damage repair was discussed with her. The patient's questions were answered to her satisfaction and she is ready to proceed with surgery.  Dara LordsFONTAINE,Graden Hoshino P MD, 12:24 PM 01/11/2016

## 2016-01-14 ENCOUNTER — Encounter: Payer: Self-pay | Admitting: Gynecology

## 2016-01-14 NOTE — H&P (Signed)
Bianca Walton 09-May-1970 [id number]   History and Physical  Chief complaint: Persistent right ovarian cyst, persistent right pelvic pain  History of present illness: 45 y.o. G2P1011 with history of persistent right ovarian cyst, 5 cm, simple, avascular with CA 125 9. Present for over one year. Now causing patient right-sided discomfort. Admitted for laparoscopic right ovarian cystectomy.  Past medical history,surgical history, medications, allergies, family history and social history were all reviewed and documented in the EPIC chart.  ROS:  Was performed and pertinent positives and negatives are included in the history of present illness.  Exam: 01/11/2016 General: well developed, well nourished female, no acute distress HEENT: normal  Lungs: clear to auscultation without wheezing, rales or rhonchi  Cardiac: regular rate without rubs, murmurs or gallops  Abdomen: soft, nontender without masses, guarding, rebound, organomegaly  01/07/2016 [place] assist Pelvic: external bus vagina: normal   Cervix: grossly normal  Uterus: normal size, midline and mobile, nontender  Adnexa: without masses or tenderness    Assessment/Plan:  45 y.o. G2P1011 with persistent right ovarian cyst causing her discomfort as an aching to sharp stabbing right pelvic pain. Appears benign on ultrasound without concerning changes and has remained stable over the last year and a half since her prior measurement. Normal CA-125. Reviewed with patient most likely a benign tumor such as serous cystadenoma. Patient does understand though I cannot guarantee pathology and it may represent a borderline or frankly malignant ovarian cancer. If so then referral to a gynecologic oncologist for definitive surgery would be made. She is comfortable not being seen by a gynecologic oncologist at this time. She also is having menopausal symptoms with some irregular bleeding and hot flushes with elevated FSH. It appears that she is  experiencing premature ovarian failure. The issues as to whether we should proceed with a prophylactic salpingo-oophorectomy bilateral since we are already doing surgery and then plan on HRT afterwards was discussed. I reviewed with her that with premature ovarian failure she may or may not regain ovarian function in the future as a poorly understood phenomenon. Also reviewed whether, even though her ovaries are not producing sufficient amounts of hormones to prevent symptoms they are producing hormones that may be beneficial to her and that this may go on for some time. We discussed weighing the risks/benefits of keeping her ovaries for whatever future hormonal production they have clearly recognizing we do not have a crystal ball and that we may end up putting her on HRT regardless and she would maintain the risk of ovarian cancer or future problems requiring intervention. We also reviewed the issues of removing her ovaries in a 45 year old female even though they appear to be failing at this time not knowing what the future will hold. Lastly I discussed the issues of contraception and whether she would want to proceed with tubal sterilization/salpingectomies as a risk reductive surgery if she did decide to keep her ovaries. After a very lengthy discussion the patient would prefer to proceed with a right ovarian cystectomy possible right salpingo-oophorectomy if as an intraoperative decision removing the cyst alone would leave little viable tissue or for other technical reasons. Regardless she wants to retain ovarian function bilaterally at this point as best she can and does not want her ovaries removed. She does not want prophylactic salpingectomies or tubal sterilization as she states that she would accept a pregnancy at this point if it occurred. The expected intraoperative and postoperative courses as well as the recovery period were reviewed. The  risks of infection, prolonged antibiotics, reoperation for  abscess or hematoma formation was discussed. The risks of hemorrhage necessitating transfusion and the risks of transfusion reaction, hepatitis, HIV, mad cow disease and other unknown entities was also discussed. Incisional complications to include opening and draining of incisions and closure by secondary intention, dehiscence and long-term issues of keloid/cosmetics and hernia formation were reviewed. The risk of inadvertent injury to internal organs including bowel, bladder, ureters, vessels, nerves either immediately recognized or delay recognized necessitating major exploratory reparative surgeries and future reparative surgeries including bowel resection, ostomy formation, bladder repair, ureteral damage repair was discussed with her. The patient's questions were answered to her satisfaction and she is ready to proceed with surgery.    [name] MD, 10:14 AM 01/14/2016

## 2016-01-15 NOTE — Patient Instructions (Signed)
Your procedure is scheduled on:  Tuesday, Nov. 21, 2017  [facility]  [facility]00 AM  Pick up the phone at the desk and dial [phone].  Call this number if you have problems the morning of surgery: [phone].  Remember: Do NOT eat food or  drink after:  Midnight Monday  Take these medicines the morning of surgery with a SIP OF WATER:  None  Stop ALL herbal medications at this time   Do NOT wear jewelry (body piercing), metal hair clips/bobby pins, make-up, or nail polish. Do NOT wear lotions, powders, or perfumes.  You may wear deodorant. Do NOT shave for 48 hours prior to surgery. Do NOT bring valuables to the hospital. Contacts, dentures, or bridgework may not be worn into surgery.  Have a responsible adult drive you home and stay with you for 24 hours after your procedure

## 2016-01-16 ENCOUNTER — Encounter (HOSPITAL_COMMUNITY)
Admission: RE | Admit: 2016-01-16 | Discharge: 2016-01-16 | Disposition: A | Discharge status: Home / Self Care | Payer: BC Managed Care – PPO | Source: Ambulatory Visit | Attending: Gynecology | Admitting: Gynecology

## 2016-01-16 ENCOUNTER — Encounter (HOSPITAL_COMMUNITY): Payer: Self-pay

## 2016-01-16 DIAGNOSIS — R102 Pelvic and perineal pain: Secondary | ICD-10-CM | POA: Insufficient documentation

## 2016-01-16 DIAGNOSIS — N83291 Other ovarian cyst, right side: Secondary | ICD-10-CM | POA: Diagnosis not present

## 2016-01-16 DIAGNOSIS — Z01812 Encounter for preprocedural laboratory examination: Secondary | ICD-10-CM | POA: Diagnosis not present

## 2016-01-16 LAB — CBC
HCT: 38.5 % (ref 36.0–46.0)
Hemoglobin: 13.4 g/dL (ref 12.0–15.0)
MCH: 31.2 pg (ref 26.0–34.0)
MCHC: 34.8 g/dL (ref 30.0–36.0)
MCV: 89.5 fL (ref 78.0–100.0)
Platelets: 216 10*3/uL (ref 150–400)
RBC: 4.3 MIL/uL (ref 3.87–5.11)
RDW: 12 % (ref 11.5–15.5)
WBC: 6.6 10*3/uL (ref 4.0–10.5)

## 2016-01-21 ENCOUNTER — Telehealth: Payer: Self-pay | Admitting: *Deleted

## 2016-01-21 MED ORDER — DEXTROSE 5 % IV SOLN
2.0000 g | INTRAVENOUS | Status: AC
  Administered 2016-01-22: 2 g via INTRAVENOUS
  Filled 2016-01-21: qty 2

## 2016-01-21 NOTE — Telephone Encounter (Signed)
Patient scheduled for surgery tomorrow, asked if she can take tylenol for a headache, due to her sinus? Please advise

## 2016-01-21 NOTE — Telephone Encounter (Signed)
Pt informed with the below note. 

## 2016-01-21 NOTE — Telephone Encounter (Signed)
She can take Tylenol.

## 2016-01-22 ENCOUNTER — Encounter (HOSPITAL_COMMUNITY): Payer: Self-pay | Admitting: *Deleted

## 2016-01-22 ENCOUNTER — Ambulatory Visit (HOSPITAL_COMMUNITY): Payer: BC Managed Care – PPO | Admitting: Anesthesiology

## 2016-01-22 ENCOUNTER — Ambulatory Visit (HOSPITAL_COMMUNITY)
Admission: RE | Admit: 2016-01-22 | Discharge: 2016-01-22 | Disposition: A | Discharge status: Home / Self Care | Payer: BC Managed Care – PPO | Source: Ambulatory Visit | Attending: Gynecology | Admitting: Gynecology

## 2016-01-22 ENCOUNTER — Encounter (HOSPITAL_COMMUNITY)
Admission: RE | Disposition: A | Discharge status: Home / Self Care | Payer: Self-pay | Source: Ambulatory Visit | Attending: Gynecology

## 2016-01-22 DIAGNOSIS — R102 Pelvic and perineal pain: Secondary | ICD-10-CM | POA: Insufficient documentation

## 2016-01-22 DIAGNOSIS — Z87891 Personal history of nicotine dependence: Secondary | ICD-10-CM | POA: Diagnosis not present

## 2016-01-22 DIAGNOSIS — N83201 Unspecified ovarian cyst, right side: Secondary | ICD-10-CM | POA: Insufficient documentation

## 2016-01-22 HISTORY — PX: LAPAROSCOPIC OVARIAN CYSTECTOMY: SHX6248

## 2016-01-22 LAB — HCG, SERUM, QUALITATIVE: Preg, Serum: NEGATIVE

## 2016-01-22 SURGERY — EXCISION, CYST, OVARY, LAPAROSCOPIC
Anesthesia: General | Site: Abdomen | Laterality: Right

## 2016-01-22 MED ORDER — MIDAZOLAM HCL 2 MG/2ML IJ SOLN
INTRAMUSCULAR | Status: AC
  Filled 2016-01-22: qty 2

## 2016-01-22 MED ORDER — KETOROLAC TROMETHAMINE 30 MG/ML IJ SOLN: 30.0000 mg | Freq: Once | INTRAMUSCULAR | Status: DC

## 2016-01-22 MED ORDER — HYDROMORPHONE HCL 1 MG/ML IJ SOLN: 0.2500 mg | INTRAMUSCULAR | Status: DC | PRN

## 2016-01-22 MED ORDER — SCOPOLAMINE 1 MG/3DAYS TD PT72
1.0000 | MEDICATED_PATCH | Freq: Once | TRANSDERMAL | Status: DC
  Administered 2016-01-22: 1.5 mg via TRANSDERMAL

## 2016-01-22 MED ORDER — ROCURONIUM BROMIDE 100 MG/10ML IV SOLN
INTRAVENOUS | Status: AC
  Filled 2016-01-22: qty 1

## 2016-01-22 MED ORDER — GLYCOPYRROLATE 0.2 MG/ML IJ SOLN
INTRAMUSCULAR | Status: AC
  Filled 2016-01-22: qty 2

## 2016-01-22 MED ORDER — ONDANSETRON HCL 4 MG/2ML IJ SOLN: 4.0000 mg | Freq: Once | INTRAMUSCULAR | Status: DC | PRN

## 2016-01-22 MED ORDER — NEOSTIGMINE METHYLSULFATE 10 MG/10ML IV SOLN
INTRAVENOUS | Status: DC | PRN
  Administered 2016-01-22: 2 mg via INTRAVENOUS

## 2016-01-22 MED ORDER — BUPIVACAINE HCL (PF) 0.25 % IJ SOLN
INTRAMUSCULAR | Status: AC
Start: 2016-01-22 — End: 2016-01-22
  Filled 2016-01-22: qty 30

## 2016-01-22 MED ORDER — LACTATED RINGERS IV SOLN
INTRAVENOUS | Status: DC
  Administered 2016-01-22 (×2): via INTRAVENOUS

## 2016-01-22 MED ORDER — OXYCODONE HCL 5 MG PO TABS: 5.0000 mg | ORAL_TABLET | Freq: Once | ORAL | Status: DC | PRN

## 2016-01-22 MED ORDER — OXYCODONE-ACETAMINOPHEN 5-325 MG PO TABS: 1.0000 | ORAL_TABLET | ORAL | 0 refills | Status: DC | PRN

## 2016-01-22 MED ORDER — ACETAMINOPHEN 160 MG/5ML PO SOLN: 325.0000 mg | ORAL | Status: DC | PRN

## 2016-01-22 MED ORDER — PROPOFOL 10 MG/ML IV BOLUS
INTRAVENOUS | Status: DC | PRN
  Administered 2016-01-22: 150 mg via INTRAVENOUS

## 2016-01-22 MED ORDER — PROPOFOL 10 MG/ML IV BOLUS
INTRAVENOUS | Status: AC
  Filled 2016-01-22: qty 20

## 2016-01-22 MED ORDER — LACTATED RINGERS IR SOLN
Status: DC | PRN
  Administered 2016-01-22: 3000 mL

## 2016-01-22 MED ORDER — BUPIVACAINE HCL (PF) 0.25 % IJ SOLN
INTRAMUSCULAR | Status: DC | PRN
  Administered 2016-01-22: 10 mL

## 2016-01-22 MED ORDER — NEOSTIGMINE METHYLSULFATE 10 MG/10ML IV SOLN
INTRAVENOUS | Status: AC
  Filled 2016-01-22: qty 1

## 2016-01-22 MED ORDER — OXYCODONE HCL 5 MG/5ML PO SOLN: 5.0000 mg | Freq: Once | ORAL | Status: DC | PRN

## 2016-01-22 MED ORDER — GLYCOPYRROLATE 0.2 MG/ML IJ SOLN
INTRAMUSCULAR | Status: DC | PRN
  Administered 2016-01-22: 0.4 mg via INTRAVENOUS

## 2016-01-22 MED ORDER — LIDOCAINE HCL (CARDIAC) 20 MG/ML IV SOLN
INTRAVENOUS | Status: AC
  Filled 2016-01-22: qty 5

## 2016-01-22 MED ORDER — SCOPOLAMINE 1 MG/3DAYS TD PT72
MEDICATED_PATCH | TRANSDERMAL | Status: AC
  Filled 2016-01-22: qty 1

## 2016-01-22 MED ORDER — ONDANSETRON HCL 4 MG/2ML IJ SOLN
INTRAMUSCULAR | Status: DC | PRN
  Administered 2016-01-22: 4 mg via INTRAVENOUS

## 2016-01-22 MED ORDER — FENTANYL CITRATE (PF) 250 MCG/5ML IJ SOLN
INTRAMUSCULAR | Status: AC
  Filled 2016-01-22: qty 5

## 2016-01-22 MED ORDER — DEXAMETHASONE SODIUM PHOSPHATE 10 MG/ML IJ SOLN
INTRAMUSCULAR | Status: AC
  Filled 2016-01-22: qty 1

## 2016-01-22 MED ORDER — MIDAZOLAM HCL 2 MG/2ML IJ SOLN
INTRAMUSCULAR | Status: DC | PRN
Start: 2016-01-22 — End: 2016-01-22
  Administered 2016-01-22: 2 mg via INTRAVENOUS

## 2016-01-22 MED ORDER — ACETAMINOPHEN 325 MG PO TABS: 325.0000 mg | ORAL_TABLET | ORAL | Status: DC | PRN

## 2016-01-22 MED ORDER — KETOROLAC TROMETHAMINE 30 MG/ML IJ SOLN
INTRAMUSCULAR | Status: DC | PRN
  Administered 2016-01-22: 30 mg via INTRAVENOUS

## 2016-01-22 MED ORDER — DEXAMETHASONE SODIUM PHOSPHATE 10 MG/ML IJ SOLN
INTRAMUSCULAR | Status: DC | PRN
  Administered 2016-01-22: 10 mg via INTRAVENOUS

## 2016-01-22 MED ORDER — ROCURONIUM BROMIDE 100 MG/10ML IV SOLN
INTRAVENOUS | Status: DC | PRN
  Administered 2016-01-22: 35 mg via INTRAVENOUS

## 2016-01-22 MED ORDER — LIDOCAINE HCL (CARDIAC) 20 MG/ML IV SOLN
INTRAVENOUS | Status: DC | PRN
  Administered 2016-01-22: 50 mg via INTRAVENOUS

## 2016-01-22 MED ORDER — ONDANSETRON HCL 4 MG/2ML IJ SOLN
INTRAMUSCULAR | Status: AC
  Filled 2016-01-22: qty 2

## 2016-01-22 MED ORDER — MEPERIDINE HCL 25 MG/ML IJ SOLN: 6.2500 mg | INTRAMUSCULAR | Status: DC | PRN

## 2016-01-22 MED ORDER — KETOROLAC TROMETHAMINE 30 MG/ML IJ SOLN
INTRAMUSCULAR | Status: AC
  Filled 2016-01-22: qty 1

## 2016-01-22 MED ORDER — FENTANYL CITRATE (PF) 100 MCG/2ML IJ SOLN
INTRAMUSCULAR | Status: DC | PRN
  Administered 2016-01-22: 100 ug via INTRAVENOUS
  Administered 2016-01-22 (×3): 50 ug via INTRAVENOUS

## 2016-01-22 SURGICAL SUPPLY — 33 items
BARRIER ADHS 3X4 INTERCEED (GAUZE/BANDAGES/DRESSINGS) IMPLANT
BLADE SURG 15 STRL LF C SS BP (BLADE) IMPLANT
BLADE SURG 15 STRL SS (BLADE)
CABLE HIGH FREQUENCY MONO STRZ (ELECTRODE) IMPLANT
CATH ROBINSON RED A/P 16FR (CATHETERS) ×2 IMPLANT
CLOTH BEACON ORANGE TIMEOUT ST (SAFETY) ×2 IMPLANT
DILATOR CANAL MILEX (MISCELLANEOUS) ×2 IMPLANT
DRSG OPSITE POSTOP 3X4 (GAUZE/BANDAGES/DRESSINGS) ×2 IMPLANT
GLOVE BIO SURGEON STRL SZ7.5 (GLOVE) ×2 IMPLANT
GLOVE BIOGEL PI IND STRL 7.0 (GLOVE) ×2 IMPLANT
GLOVE BIOGEL PI INDICATOR 7.0 (GLOVE) ×2
GOWN STRL REUS W/TWL LRG LVL3 (GOWN DISPOSABLE) ×8 IMPLANT
LIQUID BAND (GAUZE/BANDAGES/DRESSINGS) ×2 IMPLANT
NS IRRIG 1000ML POUR BTL (IV SOLUTION) IMPLANT
PACK LAPAROSCOPY BASIN (CUSTOM PROCEDURE TRAY) ×2 IMPLANT
PACK TRENDGUARD 450 HYBRID PRO (MISCELLANEOUS) ×1 IMPLANT
PACK TRENDGUARD 600 HYBRD PROC (MISCELLANEOUS) IMPLANT
POUCH LAPAROSCOPIC INSTRUMENT (MISCELLANEOUS) ×2 IMPLANT
POUCH SPECIMEN RETRIEVAL 10MM (ENDOMECHANICALS) ×2 IMPLANT
PROTECTOR NERVE ULNAR (MISCELLANEOUS) ×6 IMPLANT
SET IRRIG TUBING LAPAROSCOPIC (IRRIGATION / IRRIGATOR) ×2 IMPLANT
SHEARS HARMONIC ACE PLUS 36CM (ENDOMECHANICALS) ×2 IMPLANT
SLEEVE XCEL OPT CAN 5 100 (ENDOMECHANICALS) ×2 IMPLANT
SUT PLAIN 4 0 FS 2 27 (SUTURE) ×2 IMPLANT
SUT VICRYL 0 UR6 27IN ABS (SUTURE) ×2 IMPLANT
SYRINGE 60CC LL (MISCELLANEOUS) ×2 IMPLANT
TOWEL OR 17X24 6PK STRL BLUE (TOWEL DISPOSABLE) ×4 IMPLANT
TRENDGUARD 450 HYBRID PRO PACK (MISCELLANEOUS) ×2
TRENDGUARD 600 HYBRID PROC PK (MISCELLANEOUS)
TROCAR XCEL NON-BLD 11X100MML (ENDOMECHANICALS) ×2 IMPLANT
TROCAR XCEL NON-BLD 5MMX100MML (ENDOMECHANICALS) ×2 IMPLANT
WARMER LAPAROSCOPE (MISCELLANEOUS) ×2 IMPLANT
WATER STERILE IRR 1000ML POUR (IV SOLUTION) IMPLANT

## 2016-01-22 NOTE — Transfer of Care (Signed)
Immediate Anesthesia Transfer of Care Note  Patient: Bianca Walton  Procedure(s) Performed: Procedure(s): LAPAROSCOPIC RIGHT OVARIAN CYSTECTOMY (Right)  Patient Location: PACU  Anesthesia Type:General  Level of Consciousness: awake, alert  and oriented  Airway & Oxygen Therapy: Patient Spontanous Breathing and Patient connected to nasal cannula oxygen  Post-op Assessment: Report given to RN and Post -op Vital signs reviewed and stable  Post vital signs: Reviewed and stable  Last Vitals:  Vitals:   01/22/16 0830 01/22/16 1032  BP: 92/66 (P) 110/84  Pulse: 83 (P) 64  Resp: 18 (P) 14  Temp: 37.2 C (P) 37.1 C    Last Pain:  Vitals:   01/22/16 0830  TempSrc: Oral      Patients Stated Pain Goal: 4 (01/22/16 0830)  Complications: No apparent anesthesia complications

## 2016-01-22 NOTE — Op Note (Signed)
Scherrie BatemanSuzanne L Garrelts 26-Jun-1970 [id number]   Post Operative Note   Date of surgery:  01/22/2016  Pre Op Dx:  Persistent right ovarian cyst, pelvic pain  Post Op Dx:  Persistent right ovarian cyst, pelvic pain  Procedure:  Laparoscopic right ovarian cystectomy  Surgeon:  Dara LordsFONTAINE,TIMOTHY P  Assistant:  Reynaldo MiniumFernandez, Juan  Anesthesia:  General  EBL:  Minimal   Complications:  None  Specimen:  #1 opening cell washing #2 cyst fluid #3 right ovarian cyst to pathology  Findings: EUA:  External BUS vagina normal. Cervix normal. Uterus grossly normal size midline mobile. Right adnexa with fullness consistent with history of right ovarian cyst. Left adnexa without masses   Operative:  Anterior cul-de-sac normal. Posterior cul-de-sac normal. Uterus normal size shape and contour. Right and left fallopian tubes normal length caliper and fimbriated ends. Left ovary grossly normal. Right ovary enlarged approximately 5 cm with single cyst. No evidence of pelvic endometriosis or adhesions. Upper abdominal exam shows appendix grossly normal free and mobile. Liver smooth without abnormality. Gallbladder not visualized. Midline upper abdominal omental to abdominal wall adhesions noted, left undisturbed. Right ovarian cyst noted to removed intact  Procedure:  The patient was taken to the operating room, placed in the low dorsal lithotomy position, underwent general anesthesia, received an abdominal preparation with DuraPrep and a perineal/vaginal preparation with Betadine solution. The bladder was emptied with in and out Foley catheterization done in sterile technique and an EUA was performed. The timeout was performed by the surgical team. The patient was draped in usual fashion. A transverse infraumbilical incision was made and using the 10 mm Optiview type direct entry trocar the abdomen was directly entered under direct visualization without difficulty. The abdomen was insufflated, right and left 5 mm ports were  then placed suprapubically under direct visualization after transillumination for the vessels without difficulty. Examination of the pelvic organs and upper abdominal exam was carried out with findings noted above. The opening cell washing was taken. The right ovary was then elevated, the right ureter identified away from the surgical site and the ovary was stabilized. Using the harmonic scalpel, the ovarian capsule was entered and through sharp and blunt dissection and the ovarian cyst was freed from the ovarian stroma without difficulty noted to be intact. A good amount of right ovarian tissue remained after the procedure. An Endopouch was then placed through the infraumbilical port. A 5 mm laparoscope through the right suprapubic port and the cyst was retrieved to the level of the umbilicus. The Endopouch bag was then opened and the cyst was aspirated within the bag to allow delivery through the smaller infaumbilical incision with no spillage of cyst fluid noted. The specimen and cyst fluid were sent to pathology. The 10 mm laparoscope was then reintroduced infraumbilically and the abdomen reinsufflated. Copious irrigation showed adequate hemostasis at the right ovary and Interceed was placed covering the surgical site.  The right and left suprapubic ports were removed under direct visualization showing adequate hemostasis and the infraumbilical port was backed out under direct visualization showing adequate hemostasis and no evidence of hernia formation. The infraumbilical fascia was reapproximated in a running stitch using 0 Vicryl suture, the skin reapproximated using 3-0 plain suture in a running subcuticular stitch. All skin incisions injected using 0.25% Marcaine and all skin incisions ultimately closed using LiquiBand skin adhesive. The patient received intraoperative Toradol, was awakened without difficulty and taken recovery room in good condition having tolerated procedure  well.     FONTAINE,TIMOTHY P  MD, 10:35 AM 01/22/2016

## 2016-01-22 NOTE — Anesthesia Preprocedure Evaluation (Signed)
Anesthesia Evaluation  Patient identified by MRN, date of birth, ID band Patient awake    Reviewed: Allergy & Precautions, H&P , NPO status , Patient's Chart, lab work & pertinent test results  Airway Mallampati: I  TM Distance: >3 FB Neck ROM: full    Dental no notable dental hx. (+) Teeth Intact   Pulmonary former smoker,    Pulmonary exam normal        Cardiovascular negative cardio ROS Normal cardiovascular exam     Neuro/Psych negative neurological ROS  negative psych ROS   GI/Hepatic negative GI ROS, Neg liver ROS,   Endo/Other  negative endocrine ROS  Renal/GU negative Renal ROS     Musculoskeletal   Abdominal Normal abdominal exam  (+)   Peds  Hematology negative hematology ROS (+)   Anesthesia Other Findings   Reproductive/Obstetrics negative OB ROS                             Anesthesia Physical Anesthesia Plan  ASA: II  Anesthesia Plan: General   Post-op Pain Management:    Induction: Intravenous  Airway Management Planned: Oral ETT  Additional Equipment:   Intra-op Plan:   Post-operative Plan: Extubation in OR  Informed Consent: I have reviewed the patients History and Physical, chart, labs and discussed the procedure including the risks, benefits and alternatives for the proposed anesthesia with the patient or authorized representative who has indicated his/her understanding and acceptance.   Dental Advisory Given  Plan Discussed with: CRNA and Surgeon  Anesthesia Plan Comments:         Anesthesia Quick Evaluation

## 2016-01-22 NOTE — Anesthesia Procedure Notes (Signed)
Procedure Name: Intubation Date/Time: 01/22/2016 9:24 AM Performed by: Janeece AgeeWRAPE, Samyah Bilbo W Pre-anesthesia Checklist: Patient identified, Emergency Drugs available, Suction available, Patient being monitored and Timeout performed Patient Re-evaluated:Patient Re-evaluated prior to inductionOxygen Delivery Method: Circle system utilized Preoxygenation: Pre-oxygenation with 100% oxygen Intubation Type: IV induction Ventilation: Mask ventilation without difficulty Laryngoscope Size: Mac and 3 Grade View: Grade II Tube type: Oral Laser Tube: Cuffed inflated with minimal occlusive pressure - saline Tube size: 7.0 mm Number of attempts: 2 Airway Equipment and Method: Stylet Placement Confirmation: ETT inserted through vocal cords under direct vision,  positive ETCO2 and breath sounds checked- equal and bilateral Secured at: 21 cm Tube secured with: Tape Dental Injury: Injury to lip

## 2016-01-22 NOTE — Anesthesia Procedure Notes (Signed)
Performed by: Lior Hoen W       

## 2016-01-22 NOTE — H&P (Signed)
The patient was examined.  I reviewed the proposed surgery and consent form with the patient.  The dictated history and physical is current and accurate and all questions were answered. The patient is ready to proceed with surgery and has a realistic understanding and expectation for the outcome.   Dara LordsFONTAINE,TIMOTHY P MD, 8:41 AM 01/22/2016

## 2016-01-22 NOTE — Anesthesia Postprocedure Evaluation (Signed)
Anesthesia Post Note  Patient: Bianca BatemanSuzanne L Rochon  Procedure(s) Performed: Procedure(s) (LRB): LAPAROSCOPIC RIGHT OVARIAN CYSTECTOMY (Right)  Patient location during evaluation: PACU Anesthesia Type: General Level of consciousness: awake Pain management: pain level controlled Vital Signs Assessment: post-procedure vital signs reviewed and stable Respiratory status: spontaneous breathing Cardiovascular status: stable Postop Assessment: no signs of nausea or vomiting Anesthetic complications: no     Last Vitals:  Vitals:   01/22/16 1145 01/22/16 1200  BP: (!) 92/51   Pulse: (!) 58 62  Resp: 10 12  Temp:      Last Pain:  Vitals:   01/22/16 0830  TempSrc: Oral   Pain Goal: Patients Stated Pain Goal: Other (Comment) (resting with eyes closed) (01/22/16 1100)               Jaidev Sanger JR,JOHN Harbour Nordmeyer

## 2016-01-22 NOTE — Discharge Instructions (Signed)
Postoperative Instructions Laparoscopy ° °Dr. Fontaine and the nursing staff have discussed postoperative instructions with you.  If you have any questions please ask them before you leave the hospital, or call Dr Fontaine’s office at [phone].   ° °We would like to emphasize the following instructions: ° ° °? Call the office to make your follow-up appointment as recommended by Dr Fontaine (usually 1-2 weeks). ° °? You were given a prescription, or one was ordered for you at the pharmacy you designated.  Get that prescription filled and take the medication according to instructions. ° °? You may eat a regular diet, but slowly until you start having bowel movements. ° °? Drink plenty of water daily. ° °? Nothing in the vagina (intercourse, douching, objects of any kind) for 2 weeks.  When reinitiating intercourse, if it is uncomfortable, stop and make an appointment with Dr Fontaine to be evaluated. ° °? No driving for several days until the anesthesia has worn off and you are not having significant pain.  Car rides (short) are ok, as long as you are not having significant pain, but no traveling out of town until your postoperative appointment. ° °? You may shower, but no baths for two weeks.  Walking up and down stairs is ok.  No heavy lifting, prolonged standing, repeated bending or any “working out” until your first  postoperative appointment. ° °? Rest frequently, listen to your body and do not push yourself and overdo it. ° °? Call if: ° °o Your pain medication does not seem strong enough. °o Worsening pain or abdominal bloating °o Persistent nausea or vomiting °o Difficulty with urination or bowel movements. °o Temperature of 101 degrees or higher. °o Heavy vaginal bleeding.  If your period is due, you may use tampons.   °o Incisions become red, tender or begin to drain. °o You have any questions or concerns ° ° °Post Anesthesia Home Care Instructions ° °Activity: °Get plenty of rest for the remainder of  the day. A responsible adult should stay with you for 24 hours following the procedure.  °For the next 24 hours, DO NOT: °-Drive a car °-Operate machinery °-Drink alcoholic beverages °-Take any medication unless instructed by your physician °-Make any legal decisions or sign important papers. ° °Meals: °Start with liquid foods such as gelatin or soup. Progress to regular foods as tolerated. Avoid greasy, spicy, heavy foods. If nausea and/or vomiting occur, drink only clear liquids until the nausea and/or vomiting subsides. Call your physician if vomiting continues. ° °Special Instructions/Symptoms: °Your throat may feel dry or sore from the anesthesia or the breathing tube placed in your throat during surgery. If this causes discomfort, gargle with warm salt water. The discomfort should disappear within 24 hours. ° °If you had a scopolamine patch placed behind your ear for the management of post- operative nausea and/or vomiting: ° °1. The medication in the patch is effective for 72 hours, after which it should be removed.  Wrap patch in a tissue and discard in the trash. Wash hands thoroughly with soap and water. °2. You may remove the patch earlier than 72 hours if you experience unpleasant side effects which may include dry mouth, dizziness or visual disturbances. °3. Avoid touching the patch. Wash your hands with soap and water after contact with the patch. °  ° °

## 2016-01-23 ENCOUNTER — Encounter (HOSPITAL_COMMUNITY): Payer: Self-pay | Admitting: Gynecology

## 2016-02-05 ENCOUNTER — Ambulatory Visit (INDEPENDENT_AMBULATORY_CARE_PROVIDER_SITE_OTHER): Payer: BC Managed Care – PPO | Admitting: Gynecology

## 2016-02-05 ENCOUNTER — Encounter: Payer: Self-pay | Admitting: Gynecology

## 2016-02-05 VITALS — BP 116/70

## 2016-02-05 DIAGNOSIS — Z9889 Other specified postprocedural states: Secondary | ICD-10-CM

## 2016-02-05 DIAGNOSIS — M545 Low back pain, unspecified: Secondary | ICD-10-CM

## 2016-02-05 MED ORDER — CYCLOBENZAPRINE HCL 10 MG PO TABS: 10.0000 mg | ORAL_TABLET | Freq: Three times a day (TID) | ORAL | 0 refills | Status: DC | PRN

## 2016-02-05 NOTE — Progress Notes (Signed)
    Bianca BatemanSuzanne L Walton [birth year] [id number]        45 y.o.  G2P1011 presents for her postoperative check status post laparoscopic right ovarian cystectomy. Has done well since surgery although notes she pulled her back yesterday while bending. Taking ibuprofen. No sciatica type symptoms but significantly uncomfortable. No muscle strength/sensation changes.  Past medical history,surgical history, problem list, medications, allergies, family history and social history were all reviewed and documented in the EPIC chart.  Directed ROS with pertinent positives and negatives documented in the history of present illness/assessment and plan.  Exam: Bianca PortelaKim Gardner assistant Vitals:   02/05/16 1429  BP: 116/70   General appearance:  Normal Spine straight without CVA tenderness. No gross deformity or significant muscle spasm Abdomen soft nontender without masses guarding rebound. Incisions healed nicely Pelvic external BUS vagina normal. Cervix normal. Uterus normal size midline mobile nontender. Adnexa without masses or tenderness  Assessment/Plan:  45 y.o. G2P1011 with normal postoperative checkup status post laparoscopic right ovarian cystectomy. Final pathology serocystadenoma. Acute low back pain while bending. No significant findings on exam. Continue ibuprofen at 800 mg every 8 hours. Flexeril 10 mg #30 one by mouth every 8 our with precautions as far as sedation reviewed. Follow up with orthopedics of low back pain continues. Follow up with me in 7 months when due for annual exam    Dara LordsFONTAINE,TIMOTHY P MD, 2:55 PM 02/05/2016

## 2016-02-05 NOTE — Patient Instructions (Signed)
Follow up in July for annual exam, sooner if any issues.

## 2017-03-31 ENCOUNTER — Ambulatory Visit: Payer: BC Managed Care – PPO | Admitting: Family Medicine

## 2017-04-12 NOTE — Progress Notes (Deleted)
Tawana ScaleZach Ziair Penson D.O. [facility] [address] [place], [place] [address] Phone: [phone] Subjective:    I'm seeing this patient by the request  of:  Patient, No Pcp Per  CC: Back and neck pain  ION:[id number]:Subjective  Scherrie BatemanSuzanne L Robinson is a 47 y.o. female coming in with complaint of back and neck pain.  Onset-  Location Duration-  Character- Aggravating factors- Reliving factors-  Therapies tried-  Severity-     Past Medical History:  Diagnosis Date  . Allergy    seasonal, pollen, molds, some food.  . Anxiety   . Cyst, ovarian   . Depression    prozac, wellbutrin   Past Surgical History:  Procedure Laterality Date  . LAPAROSCOPIC OVARIAN CYSTECTOMY Right 01/22/2016   Procedure: LAPAROSCOPIC RIGHT OVARIAN CYSTECTOMY;  Surgeon: Dara Lordsimothy P Fontaine, MD;  Location: [facility];  Service: Gynecology;  Laterality: Right;  . NASAL SINUS SURGERY  01/2010  . TONSILLECTOMY  1993  . WISDOM TOOTH EXTRACTION  2000   x 2   Social History   Socioeconomic History  . Marital status: Married    Spouse name: Not on file  . Number of children: Not on file  . Years of education: Not on file  . Highest education level: Not on file  Social Needs  . Financial resource strain: Not on file  . Food insecurity - worry: Not on file  . Food insecurity - inability: Not on file  . Transportation needs - medical: Not on file  . Transportation needs - non-medical: Not on file  Occupational History  . Not on file  Tobacco Use  . Smoking status: Former Smoker    Last attempt to quit: 12/01/2004    Years since quitting: 12.3  . Smokeless tobacco: Never Used  Substance and Sexual Activity  . Alcohol use: Yes    Alcohol/week: 0.0 oz    Comment: 3 drinks a week.  . Drug use: No  . Sexual activity: Yes    Partners: Male    Birth control/protection: Coitus interruptus  Other Topics Concern  . Not on file  Social History Narrative  . Not on file   No Known Allergies Family History    Problem Relation Age of Onset  . Cancer Mother        melanoma  . Hyperlipidemia Mother   . Hyperlipidemia Father   . Diabetes Maternal Uncle   . Stroke Maternal Grandfather   . Heart disease Maternal Grandfather   . Diabetes Maternal Grandfather   . Stroke Paternal Grandfather   . Heart disease Paternal Grandfather   . Cancer Maternal Grandmother        throat     Past medical history, social, surgical and family history all reviewed in electronic medical record.  No pertanent information unless stated regarding to the chief complaint.   Review of Systems:Review of systems updated and as accurate as of 04/12/17  No headache, visual changes, nausea, vomiting, diarrhea, constipation, dizziness, abdominal pain, skin rash, fevers, chills, night sweats, weight loss, swollen lymph nodes, body aches, joint swelling, muscle aches, chest pain, shortness of breath, mood changes.   Objective  There were no vitals taken for this visit. Systems examined below as of 04/12/17   General: No apparent distress alert and oriented x3 mood and affect normal, dressed appropriately.  HEENT: Pupils equal, extraocular movements intact  Respiratory: Patient's speak in full sentences and does not appear short of breath  Cardiovascular: No lower extremity edema, non tender,  no erythema  Skin: Warm dry intact with no signs of infection or rash on extremities or on axial skeleton.  Abdomen: Soft nontender  Neuro: Cranial nerves II through XII are intact, neurovascularly intact in all extremities with 2+ DTRs and 2+ pulses.  Lymph: No lymphadenopathy of posterior or anterior cervical chain or axillae bilaterally.  Gait normal with good balance and coordination.  MSK:  Non tender with full range of motion and good stability and symmetric strength and tone of shoulders, elbows, wrist, hip, knee and ankles bilaterally.     Impression and Recommendations:     This case required medical decision making of  moderate complexity.      Note: This dictation was prepared with Dragon dictation along with smaller phrase technology. Any transcriptional errors that result from this process are unintentional.

## 2017-04-13 ENCOUNTER — Ambulatory Visit: Payer: BC Managed Care – PPO | Admitting: Family Medicine
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
## 2017-04-29 ENCOUNTER — Encounter: Payer: Self-pay | Admitting: Family Medicine

## 2017-04-29 ENCOUNTER — Other Ambulatory Visit: Payer: Self-pay

## 2017-04-29 ENCOUNTER — Ambulatory Visit (INDEPENDENT_AMBULATORY_CARE_PROVIDER_SITE_OTHER): Payer: BC Managed Care – PPO | Admitting: Family Medicine

## 2017-04-29 ENCOUNTER — Ambulatory Visit (INDEPENDENT_AMBULATORY_CARE_PROVIDER_SITE_OTHER)
Admission: RE | Admit: 2017-04-29 | Discharge: 2017-04-29 | Disposition: A | Discharge status: Home / Self Care | Payer: BC Managed Care – PPO | Source: Ambulatory Visit | Attending: Family Medicine | Admitting: Family Medicine

## 2017-04-29 VITALS — BP 120/90 | HR 89 | Ht 67.0 in | Wt 150.0 lb

## 2017-04-29 DIAGNOSIS — M545 Low back pain, unspecified: Secondary | ICD-10-CM

## 2017-04-29 DIAGNOSIS — M549 Dorsalgia, unspecified: Secondary | ICD-10-CM | POA: Diagnosis not present

## 2017-04-29 DIAGNOSIS — M542 Cervicalgia: Secondary | ICD-10-CM

## 2017-04-29 DIAGNOSIS — G8929 Other chronic pain: Secondary | ICD-10-CM | POA: Insufficient documentation

## 2017-04-29 MED ORDER — DICLOFENAC SODIUM 2 % TD SOLN: 2.0000 g | Freq: Two times a day (BID) | TRANSDERMAL | 3 refills | Status: DC

## 2017-04-29 MED ORDER — GABAPENTIN 100 MG PO CAPS: 200.0000 mg | ORAL_CAPSULE | Freq: Every day | ORAL | 3 refills | Status: DC

## 2017-04-29 NOTE — Patient Instructions (Addendum)
Good to see you.  PT will be calling you  Ice 20 minutes 2 times daily. Usually after activity and before bed. Exercises 3 times a week.  Gabapentin 200mg  at night- and may be able to come off the trazadone.  Over the counter get  Vitamin D 5000 IU daily for 1 month Turmeric 500mg  twice daily  Tart cherry extract any dose at night pennsaid pinkie amount topically 2 times daily as needed.   See me again in 4 weeks and we will make sure you are doing better

## 2017-04-29 NOTE — Assessment & Plan Note (Signed)
Mild arthritic changes of multiple joints.  Possible some progression.  We will get complete x-ray of the cervical spine.  Gabapentin given, home exercises and will start with physical therapy.  Follow-up again in 4 weeks

## 2017-04-29 NOTE — Assessment & Plan Note (Signed)
Chronic pain  On many meds Started HEP, sent to PT and gabapentin  Topical anti-inflammatories given as well.  X-rays ordered.  Follow-up again in 4 weeks

## 2017-05-25 ENCOUNTER — Other Ambulatory Visit: Payer: Self-pay | Admitting: *Deleted

## 2017-05-25 MED ORDER — GABAPENTIN 100 MG PO CAPS: 200.0000 mg | ORAL_CAPSULE | Freq: Every day | ORAL | 1 refills | Status: DC

## 2017-05-27 NOTE — Progress Notes (Signed)
[facility] [address] [place], [place] [address] Phone: [phone] Subjective:     CC: Back pain and neck pain follow-up  BJY:[id number]  Bianca Walton is a 47 y.o. female coming in with complaint of back pain. Her back has been doing better. She is having improvement after performing hip flexor stretching.   Patient was found to have mild osteoarthritic changes of the back as well as moderate osteoarthritic changes of the neck.  Patient given home exercises, doing over-the-counter medications.  States that she is about 50% better.  Has changed some of the posture which seems to be a little bit more helpful.    Past Medical History:  Diagnosis Date  . Allergy    seasonal, pollen, molds, some food.  . Anxiety   . Cyst, ovarian   . Depression    prozac, wellbutrin   Past Surgical History:  Procedure Laterality Date  . LAPAROSCOPIC OVARIAN CYSTECTOMY Right 01/22/2016   Procedure: LAPAROSCOPIC RIGHT OVARIAN CYSTECTOMY;  Surgeon: [name], MD;  Location: [facility];  Service: Gynecology;  Laterality: Right;  . NASAL SINUS SURGERY  01/2010  . TONSILLECTOMY  1993  . WISDOM TOOTH EXTRACTION  2000   x 2   Social History   Socioeconomic History  . Marital status: Married    Spouse name: Not on file  . Number of children: Not on file  . Years of education: Not on file  . Highest education level: Not on file  Occupational History  . Not on file  Social Needs  . Financial resource strain: Not on file  . Food insecurity:    Worry: Not on file    Inability: Not on file  . Transportation needs:    Medical: Not on file    Non-medical: Not on file  Tobacco Use  . Smoking status: Former Smoker    Last attempt to quit: 12/01/2004    Years since quitting: 12.4  . Smokeless tobacco: Never Used  Substance and Sexual Activity  . Alcohol use: Yes    Alcohol/week: 0.0 oz    Comment: 3 drinks a week.  . Drug use: No  . Sexual activity: Yes   Partners: Male    Birth control/protection: Coitus interruptus  Lifestyle  . Physical activity:    Days per week: Not on file    Minutes per session: Not on file  . Stress: Not on file  Relationships  . Social connections:    Talks on phone: Not on file    Gets together: Not on file    Attends religious service: Not on file    Active member of club or organization: Not on file    Attends meetings of clubs or organizations: Not on file    Relationship status: Not on file  Other Topics Concern  . Not on file  Social History Narrative  . Not on file   No Known Allergies Family History  Problem Relation Age of Onset  . Cancer Mother        melanoma  . Hyperlipidemia Mother   . Hyperlipidemia Father   . Diabetes Maternal Uncle   . Stroke Maternal Grandfather   . Heart disease Maternal Grandfather   . Diabetes Maternal Grandfather   . Stroke Paternal Grandfather   . Heart disease Paternal Grandfather   . Cancer Maternal Grandmother        throat     Past medical history, social, surgical and family history all  reviewed in electronic medical record.  No pertanent information unless stated regarding to the chief complaint.   Review of Systems:Review of systems updated and as accurate as of 05/28/17  No headache, visual changes, nausea, vomiting, diarrhea, constipation, dizziness, abdominal pain, skin rash, fevers, chills, night sweats, weight loss, swollen lymph nodes, body aches, joint swelling, chest pain, shortness of breath, mood changes.  Positive muscle aches  Objective  Blood pressure 108/74, pulse 81, height 5\' 7"  (1.702 m), weight 149 lb (67.6 kg), SpO2 98 %. Systems examined below as of 05/28/17   General: No apparent distress alert and oriented x3 mood and affect normal, dressed appropriately.  HEENT: Pupils equal, extraocular movements intact  Respiratory: Patient's speak in full sentences and does not appear short of breath  Cardiovascular: No lower extremity  edema, non tender, no erythema  Skin: Warm dry intact with no signs of infection or rash on extremities or on axial skeleton.  Abdomen: Soft nontender  Neuro: Cranial nerves II through XII are intact, neurovascularly intact in all extremities with 2+ DTRs and 2+ pulses.  Lymph: No lymphadenopathy of posterior or anterior cervical chain or axillae bilaterally.  Gait normal with good balance and coordination.  MSK:  Non tender with full range of motion and good stability and symmetric strength and tone of shoulders, elbows, wrist, hip, knee and ankles bilaterally.  Neck: Inspection loss of lordosis.Marland Kitchen. No palpable stepoffs. Negative Spurling's maneuver. Mild to moderate decrease in range of motion. Grip strength and sensation normal in bilateral hands Strength good C4 to T1 distribution No sensory change to C4 to T1 Negative Hoffman sign bilaterally Reflexes normal Tenderness in the right trapezius noted.  Back exam shows significant hip flexor tightness bilaterally.  Positive Pearlean BrownieFaber.  Patient does have some tightness in the paraspinal musculature lumbar spine right greater than left.  Negative straight leg test.  Neurovascularly intact with full strength of the lower extremities  Osteopathic findings C2 flexed rotated and side bent right C4 flexed rotated and side bent left T3 extended rotated and side bent right inhaled third rib L2 flexed rotated and side bent right Sacrum right on right      Impression and Recommendations:     This case required medical decision making of moderate complexity.      Note: This dictation was prepared with Dragon dictation along with smaller phrase technology. Any transcriptional errors that result from this process are unintentional.

## 2017-05-28 ENCOUNTER — Ambulatory Visit (INDEPENDENT_AMBULATORY_CARE_PROVIDER_SITE_OTHER): Payer: BC Managed Care – PPO | Admitting: Family Medicine

## 2017-05-28 ENCOUNTER — Encounter: Payer: Self-pay | Admitting: Family Medicine

## 2017-05-28 VITALS — BP 108/74 | HR 81 | Ht 67.0 in | Wt 149.0 lb

## 2017-05-28 DIAGNOSIS — M999 Biomechanical lesion, unspecified: Secondary | ICD-10-CM

## 2017-05-28 DIAGNOSIS — M546 Pain in thoracic spine: Secondary | ICD-10-CM | POA: Diagnosis not present

## 2017-05-28 DIAGNOSIS — M542 Cervicalgia: Secondary | ICD-10-CM

## 2017-05-28 DIAGNOSIS — G8929 Other chronic pain: Secondary | ICD-10-CM

## 2017-05-28 NOTE — Assessment & Plan Note (Signed)
Stable overall.  Responded well to osteopathic manipulation.  Responding somewhat to conservative therapy.  Patient is taking gabapentin and encouraged her to continue to do so.  Continue the home exercises and follow-up with me again in 6 weeks

## 2017-05-28 NOTE — Assessment & Plan Note (Signed)
Decision today to treat with OMT was based on Physical Exam  After verbal consent patient was treated with HVLA, ME, FPR techniques in cervical, thoracic, rib lumbar and sacral areas  Patient tolerated the procedure well with improvement in symptoms  Patient given exercises, stretches and lifestyle modifications  See medications in patient instructions if given  Patient will follow up in 6 weeks 

## 2017-05-28 NOTE — Patient Instructions (Signed)
Good to see you  Bianca Walton is your friend.  Keep working on everything.  I am impressed You did great with manipulation  Continue all vitamins See me again in 6 weeks

## 2017-06-16 ENCOUNTER — Telehealth: Payer: Self-pay | Admitting: Family Medicine

## 2017-06-16 NOTE — Telephone Encounter (Signed)
Copied from CRM [id number]. Topic: Quick Communication - See Telephone Encounter >> Jun 16, 2017  3:17 PM Eston Mouldavis, Bianca Walton wrote: CRM for notification. See Telephone encounter for: 06/16/17.  Pt called to let Dr Bianca Walton know  she does want to have the breast reduction,  as she still has pain,  she also  wants to get things moving quickly as possible as she wants to shot for   second week of may for the  surgery to be able to have the recovery time before   heavy lifting and more physical work  coming up in June.

## 2017-06-17 NOTE — Telephone Encounter (Signed)
lmovm for pt to return call.  

## 2017-06-17 NOTE — Telephone Encounter (Signed)
Has she seen plastic surgery? I will need to see her one more time as well.

## 2017-06-18 NOTE — Telephone Encounter (Signed)
Discussed with pt, scheduled app next week with dr Katrinka Blazingsmith.

## 2017-06-22 NOTE — Progress Notes (Signed)
Bianca Walton D.O. [facility] [address] [place], [place] [address] Phone: [phone] Subjective:    I'm seeing this patient by the request  of:    CC: Back pain  NFA:[id number]:Subjective  Bianca BatemanSuzanne L Walton is a 47 y.o. female coming in with complaint of back pain. She said that she feels that same as last visit. She does continue to have achiness between the scapula and in the lower back. Stretching temporary relieves pain. Patient is having left wrist fall asleep. She hasn't worn a watch in years. The right side does fall asleep when she lays down to sleep.  Patient states that she changes position though seems to get a little bit better.  Patient does have a lot of breast itching.  Has been working on posture but is finding it difficult.  Has failed all conservative therapy at this time.  Would consider surgical intervention.    Past Medical History:  Diagnosis Date  . Allergy    seasonal, pollen, molds, some food.  . Anxiety   . Cyst, ovarian   . Depression    prozac, wellbutrin   Past Surgical History:  Procedure Laterality Date  . LAPAROSCOPIC OVARIAN CYSTECTOMY Right 01/22/2016   Procedure: LAPAROSCOPIC RIGHT OVARIAN CYSTECTOMY;  Surgeon: Dara Lordsimothy P Fontaine, MD;  Location: [facility];  Service: Gynecology;  Laterality: Right;  . NASAL SINUS SURGERY  01/2010  . TONSILLECTOMY  1993  . WISDOM TOOTH EXTRACTION  2000   x 2   Social History   Socioeconomic History  . Marital status: Married    Spouse name: Not on file  . Number of children: Not on file  . Years of education: Not on file  . Highest education level: Not on file  Occupational History  . Not on file  Social Needs  . Financial resource strain: Not on file  . Food insecurity:    Worry: Not on file    Inability: Not on file  . Transportation needs:    Medical: Not on file    Non-medical: Not on file  Tobacco Use  . Smoking status: Former Smoker    Last attempt to quit: 12/01/2004    Years since quitting:  12.5  . Smokeless tobacco: Never Used  Substance and Sexual Activity  . Alcohol use: Yes    Alcohol/week: 0.0 oz    Comment: 3 drinks a week.  . Drug use: No  . Sexual activity: Yes    Partners: Male    Birth control/protection: Coitus interruptus  Lifestyle  . Physical activity:    Days per week: Not on file    Minutes per session: Not on file  . Stress: Not on file  Relationships  . Social connections:    Talks on phone: Not on file    Gets together: Not on file    Attends religious service: Not on file    Active member of club or organization: Not on file    Attends meetings of clubs or organizations: Not on file    Relationship status: Not on file  Other Topics Concern  . Not on file  Social History Narrative  . Not on file   No Known Allergies Family History  Problem Relation Age of Onset  . Cancer Mother        melanoma  . Hyperlipidemia Mother   . Hyperlipidemia Father   . Diabetes Maternal Uncle   . Stroke Maternal Grandfather   . Heart disease Maternal Grandfather   .  Diabetes Maternal Grandfather   . Stroke Paternal Grandfather   . Heart disease Paternal Grandfather   . Cancer Maternal Grandmother        throat     Past medical history, social, surgical and family history all reviewed in electronic medical record.  No pertanent information unless stated regarding to the chief complaint.   Review of Systems:Review of systems updated and as accurate as of 06/23/17  No headache, visual changes, nausea, vomiting, diarrhea, constipation, dizziness, abdominal pain, skin rash, fevers, chills, night sweats, weight loss, swollen lymph nodes, body aches, joint swelling,  chest pain, shortness of breath, mood changes.  Positive muscle aches  Objective  Blood pressure 108/64, pulse 79, height 5\' 7"  (1.702 m), weight 145 lb (65.8 kg), SpO2 98 %. Systems examined below as of 06/23/17   General: No apparent distress alert and oriented x3 mood and affect normal,  dressed appropriately.  HEENT: Pupils equal, extraocular movements intact  Respiratory: Patient's speak in full sentences and does not appear short of breath  Cardiovascular: No lower extremity edema, non tender, no erythema  Skin: Warm dry intact with no signs of infection or rash on extremities or on axial skeleton.  Abdomen: Soft nontender  Neuro: Cranial nerves II through XII are intact, neurovascularly intact in all extremities with 2+ DTRs and 2+ pulses.  Lymph: No lymphadenopathy of posterior or anterior cervical chain or axillae bilaterally.  Gait normal with good balance and coordination.  MSK:  Non tender with full range of motion and good stability and symmetric strength and tone of shoulders, elbows, wrist, hip, knee and ankles bilaterally.  Neck: Inspection loss of lordosis. No palpable stepoffs. Negative Spurling's maneuver. Mild limitation in all planes. Grip strength and sensation normal in bilateral hands Strength good C4 to T1 distribution No sensory change to C4 to T1 Negative Hoffman sign bilaterally Reflexes normal Tightness of the trapezius is bilaterally.  Patient does have some weakness of the scapula still noted.  Osteopathic findings C4 flexed rotated and side bent left C6 flexed rotated and side bent left T3 extended rotated and side bent right inhaled third rib T9 extended rotated and side bent left L2 flexed rotated and side bent right Sacrum right on right    Impression and Recommendations:     This case required medical decision making of moderate complexity.      Note: This dictation was prepared with Dragon dictation along with smaller phrase technology. Any transcriptional errors that result from this process are unintentional.

## 2017-06-23 ENCOUNTER — Encounter: Payer: Self-pay | Admitting: Family Medicine

## 2017-06-23 ENCOUNTER — Ambulatory Visit (INDEPENDENT_AMBULATORY_CARE_PROVIDER_SITE_OTHER): Payer: BC Managed Care – PPO | Admitting: Family Medicine

## 2017-06-23 VITALS — BP 108/64 | HR 79 | Ht 67.0 in | Wt 145.0 lb

## 2017-06-23 DIAGNOSIS — M545 Low back pain, unspecified: Secondary | ICD-10-CM

## 2017-06-23 DIAGNOSIS — M999 Biomechanical lesion, unspecified: Secondary | ICD-10-CM | POA: Diagnosis not present

## 2017-06-23 DIAGNOSIS — G8929 Other chronic pain: Secondary | ICD-10-CM

## 2017-06-23 DIAGNOSIS — M542 Cervicalgia: Secondary | ICD-10-CM | POA: Diagnosis not present

## 2017-06-23 NOTE — Assessment & Plan Note (Signed)
Decision today to treat with OMT was based on Physical Exam  After verbal consent patient was treated with HVLA, ME, FPR techniques in cervical, thoracic, lumbar and sacral areas  Patient tolerated the procedure well with improvement in symptoms  Patient given exercises, stretches and lifestyle modifications  See medications in patient instructions if given  Patient will follow up in 4-6 weeks 

## 2017-06-23 NOTE — Assessment & Plan Note (Signed)
Complaint the patient with chronic neck pain and chronic back pain is likely secondary to the poor posture and ergonomics.  Patient does have a significant amount of breast tissue and would consider a possible breast reduction.  Patient wants to be referred to plastic surgery to discuss.  Has failed all conservative therapy including physical therapy previously.  Discussed icing regimen and home exercises.  Patient will follow up with me again 4 weeks

## 2017-06-23 NOTE — Patient Instructions (Signed)
6 weeks

## 2017-06-25 ENCOUNTER — Telehealth: Payer: Self-pay | Admitting: Family Medicine

## 2017-06-25 ENCOUNTER — Other Ambulatory Visit: Payer: Self-pay

## 2017-06-25 ENCOUNTER — Telehealth: Payer: Self-pay

## 2017-06-25 DIAGNOSIS — G8929 Other chronic pain: Secondary | ICD-10-CM

## 2017-06-25 DIAGNOSIS — M5440 Lumbago with sciatica, unspecified side: Principal | ICD-10-CM

## 2017-06-25 NOTE — Telephone Encounter (Signed)
Copied from CRM [id number]. Topic: Quick Communication - See Telephone Encounter >> Jun 25, 2017  1:01 PM Lorrine KinMcGee, Thoren Hosang B, [place] wrote: CRM for notification. See Telephone encounter for: 06/25/17. Patient calling and is stating that the plastic surgery office is saying they have not received a referral yet. Patient states that they need the notes that Antoine PrimasZachary Smith has made. Please advise. CB#: [phone]

## 2017-06-25 NOTE — Telephone Encounter (Signed)
The referral was entered today so no it has not been faxed over yet.   Bianca Walton, can you please fax over the OV notes to Dr. Maxcine HamBower's office. Their phone number is [phone]. Their fax number was not listed on the website.

## 2017-06-25 NOTE — Telephone Encounter (Signed)
OV faxed to Dr. Maxcine HamBower's office.

## 2017-06-25 NOTE — Telephone Encounter (Unsigned)
Copied from CRM [id number]. Topic: Quick Communication - See Telephone Encounter >> Jun 25, 2017  9:16 AM Waymon AmatoBurton, Donna F wrote: CRM for notification. See Telephone encounter for: 06/25/17. Pt states that she has hurt her back and was wanting to know if zach Katrinka Blazingsmith will call something in fr the spasms   Best number [phone] she is having trrouble bending would like to now if there is other options other than advil

## 2017-06-25 NOTE — Telephone Encounter (Signed)
Called patient to provide name of Dr. Maxcine HamBower's for plastic surgery. Patient mentions that her back has been hurting since yesterday. Said that the pain is the same on the right side in a location that she has been seen for before. She said that pain can travel into to the hip. Recommended ice and IBU and gentle stretching over the weekend. Told to call back on Monday if she is still having issues. Patient voices understanding.

## 2017-06-26 NOTE — Telephone Encounter (Signed)
See additional telephone encounter from 4/25

## 2017-06-30 NOTE — Telephone Encounter (Signed)
Patient calling and states that Dr. Odis Luster office needed the last 3 OV notes with Dr. Katrinka Blazing faxed to them in order to have her appts approved. Her visit with them is tomorrow and she wants to see if this can be sent. She states that they only received the visit from last week. Phone# for Dr. Odis Luster office: [phone].

## 2017-06-30 NOTE — Telephone Encounter (Signed)
Vikki Ports this has already been done?

## 2017-06-30 NOTE — Telephone Encounter (Signed)
Sent OV notes to Dr. Odis Luster. Spoke with patient regarding sending the notes.

## 2017-07-02 ENCOUNTER — Ambulatory Visit: Payer: BC Managed Care – PPO | Admitting: Family Medicine

## 2017-07-13 ENCOUNTER — Telehealth: Payer: Self-pay

## 2017-07-13 NOTE — Telephone Encounter (Signed)
Called patient and discussed insurance issue. Told her I would talk to Dr. Katrinka Blazing and see what he needs to do on his end. Dr. Katrinka Blazing explained that he wrote everything she needed into her chart prior to authorization. For her back issue she is seeing a Land.

## 2017-07-13 NOTE — Telephone Encounter (Signed)
Copied from CRM #[id number]. Topic: General - Other >> Jul 06, 2017 10:14 AM Kennedy, Cheryl W wrote:  Pt call to say her insurance denied her surgery and she will be appealing she said she also threw her back out and is in severe pain and she has been doing all of her exercises   [phone]  

## 2017-07-13 NOTE — Telephone Encounter (Signed)
Copied from CRM [id number]. Topic: General - Other >> Jul 06, 2017 10:14 AM Percival Spanish wrote:  Pt call to say her insurance denied her surgery and she will be appealing she said she also threw her back out and is in severe pain and she has been doing all of her exercises   [phone]

## 2017-08-04 ENCOUNTER — Ambulatory Visit: Payer: BC Managed Care – PPO | Admitting: Family Medicine

## 2017-08-04 NOTE — Progress Notes (Deleted)
[facility] [address] [place], [place] [address] Phone: [phone] Subjective:    I'm seeing this patient by the request  of:    CC:   [id number]  Bianca Walton is a 47 y.o. female coming in with complaint of ***  Onset-  Location Duration-  Character- Aggravating factors- Reliving factors-  Therapies tried-  Severity-     Past Medical History:  Diagnosis Date  . Allergy    seasonal, pollen, molds, some food.  . Anxiety   . Cyst, ovarian   . Depression    prozac, wellbutrin   Past Surgical History:  Procedure Laterality Date  . LAPAROSCOPIC OVARIAN CYSTECTOMY Right 01/22/2016   Procedure: LAPAROSCOPIC RIGHT OVARIAN CYSTECTOMY;  Surgeon: [name], MD;  Location: [facility];  Service: Gynecology;  Laterality: Right;  . NASAL SINUS SURGERY  01/2010  . TONSILLECTOMY  1993  . WISDOM TOOTH EXTRACTION  2000   x 2   Social History   Socioeconomic History  . Marital status: Married    Spouse name: Not on file  . Number of children: Not on file  . Years of education: Not on file  . Highest education level: Not on file  Occupational History  . Not on file  Social Needs  . Financial resource strain: Not on file  . Food insecurity:    Worry: Not on file    Inability: Not on file  . Transportation needs:    Medical: Not on file    Non-medical: Not on file  Tobacco Use  . Smoking status: Former Smoker    Last attempt to quit: 12/01/2004    Years since quitting: 12.6  . Smokeless tobacco: Never Used  Substance and Sexual Activity  . Alcohol use: Yes    Alcohol/week: 0.0 oz    Comment: 3 drinks a week.  . Drug use: No  . Sexual activity: Yes    Partners: Male    Birth control/protection: Coitus interruptus  Lifestyle  . Physical activity:    Days per week: Not on file    Minutes per session: Not on file  . Stress: Not on file  Relationships  . Social connections:    Talks on phone: Not on file    Gets  together: Not on file    Attends religious service: Not on file    Active member of club or organization: Not on file    Attends meetings of clubs or organizations: Not on file    Relationship status: Not on file  Other Topics Concern  . Not on file  Social History Narrative  . Not on file   No Known Allergies Family History  Problem Relation Age of Onset  . Cancer Mother        melanoma  . Hyperlipidemia Mother   . Hyperlipidemia Father   . Diabetes Maternal Uncle   . Stroke Maternal Grandfather   . Heart disease Maternal Grandfather   . Diabetes Maternal Grandfather   . Stroke Paternal Grandfather   . Heart disease Paternal Grandfather   . Cancer Maternal Grandmother        throat     Past medical history, social, surgical and family history all reviewed in electronic medical record.  No pertanent information unless stated regarding to the chief complaint.   Review of Systems:Review of systems updated and as accurate as of 08/04/17  No headache, visual changes, nausea, vomiting, diarrhea, constipation, dizziness, abdominal pain, skin rash, fevers,  chills, night sweats, weight loss, swollen lymph nodes, body aches, joint swelling, muscle aches, chest pain, shortness of breath, mood changes.   Objective  There were no vitals taken for this visit. Systems examined below as of 08/04/17   General: No apparent distress alert and oriented x3 mood and affect normal, dressed appropriately.  HEENT: Pupils equal, extraocular movements intact  Respiratory: Patient's speak in full sentences and does not appear short of breath  Cardiovascular: No lower extremity edema, non tender, no erythema  Skin: Warm dry intact with no signs of infection or rash on extremities or on axial skeleton.  Abdomen: Soft nontender  Neuro: Cranial nerves II through XII are intact, neurovascularly intact in all extremities with 2+ DTRs and 2+ pulses.  Lymph: No lymphadenopathy of posterior or anterior  cervical chain or axillae bilaterally.  Gait normal with good balance and coordination.  MSK:  Non tender with full range of motion and good stability and symmetric strength and tone of shoulders, elbows, wrist, hip, knee and ankles bilaterally.     Impression and Recommendations:     This case required medical decision making of moderate complexity.      Note: This dictation was prepared with Dragon dictation along with smaller phrase technology. Any transcriptional errors that result from this process are unintentional.

## 2017-08-17 NOTE — Progress Notes (Signed)
Tawana ScaleZach Asar Evilsizer D.O. [facility] [address] [place], [place] [address] Phone: [phone] Subjective:     CC: Back pain follow-up  BJY:[id number]:Subjective  Scherrie BatemanSuzanne L Walton is a 47 y.o. female coming in with complaint of  Back pain.  Has been seen before.  Does have moderate arthritic changes on x-ray of the neck and mild loss of the back.  Patient still has constant pain but seems to be doing well with conservative therapy including osteopathic manipulation, home exercises, massage and other modalities.  Patient denies any radiation down any of the arms.  Still able to do daily activities relatively well.  No weakness.     Past Medical History:  Diagnosis Date  . Allergy    seasonal, pollen, molds, some food.  . Anxiety   . Cyst, ovarian   . Depression    prozac, wellbutrin   Past Surgical History:  Procedure Laterality Date  . LAPAROSCOPIC OVARIAN CYSTECTOMY Right 01/22/2016   Procedure: LAPAROSCOPIC RIGHT OVARIAN CYSTECTOMY;  Surgeon: Dara Lordsimothy P Fontaine, MD;  Location: [facility];  Service: Gynecology;  Laterality: Right;  . NASAL SINUS SURGERY  01/2010  . TONSILLECTOMY  1993  . WISDOM TOOTH EXTRACTION  2000   x 2   Social History   Socioeconomic History  . Marital status: Married    Spouse name: Not on file  . Number of children: Not on file  . Years of education: Not on file  . Highest education level: Not on file  Occupational History  . Not on file  Social Needs  . Financial resource strain: Not on file  . Food insecurity:    Worry: Not on file    Inability: Not on file  . Transportation needs:    Medical: Not on file    Non-medical: Not on file  Tobacco Use  . Smoking status: Former Smoker    Last attempt to quit: 12/01/2004    Years since quitting: 12.7  . Smokeless tobacco: Never Used  Substance and Sexual Activity  . Alcohol use: Yes    Alcohol/week: 0.0 oz    Comment: 3 drinks a week.  . Drug use: No  . Sexual activity: Yes    Partners: Male   Birth control/protection: Coitus interruptus  Lifestyle  . Physical activity:    Days per week: Not on file    Minutes per session: Not on file  . Stress: Not on file  Relationships  . Social connections:    Talks on phone: Not on file    Gets together: Not on file    Attends religious service: Not on file    Active member of club or organization: Not on file    Attends meetings of clubs or organizations: Not on file    Relationship status: Not on file  Other Topics Concern  . Not on file  Social History Narrative  . Not on file   No Known Allergies Family History  Problem Relation Age of Onset  . Cancer Mother        melanoma  . Hyperlipidemia Mother   . Hyperlipidemia Father   . Diabetes Maternal Uncle   . Stroke Maternal Grandfather   . Heart disease Maternal Grandfather   . Diabetes Maternal Grandfather   . Stroke Paternal Grandfather   . Heart disease Paternal Grandfather   . Cancer Maternal Grandmother        throat     Past medical history, social, surgical and family history all reviewed in  electronic medical record.  No pertanent information unless stated regarding to the chief complaint.   Review of Systems:Review of systems updated and as accurate as of 08/18/17  No headache, visual changes, nausea, vomiting, diarrhea, constipation, dizziness, abdominal pain, skin rash, fevers, chills, night sweats, weight loss, swollen lymph nodes, body aches, joint swelling, muscle aches, chest pain, shortness of breath, mood changes.   Objective  Blood pressure 120/78, pulse 81, height 5\' 7"  (1.702 m), weight 145 lb (65.8 kg), SpO2 99 %. Systems examined below as of 08/18/17   General: No apparent distress alert and oriented x3 mood and affect normal, dressed appropriately.  HEENT: Pupils equal, extraocular movements intact  Respiratory: Patient's speak in full sentences and does not appear short of breath  Cardiovascular: No lower extremity edema, non tender, no  erythema  Skin: Warm dry intact with no signs of infection or rash on extremities or on axial skeleton.  Abdomen: Soft nontender  Neuro: Cranial nerves II through XII are intact, neurovascularly intact in all extremities with 2+ DTRs and 2+ pulses.  Lymph: No lymphadenopathy of posterior or anterior cervical chain or axillae bilaterally.  Gait normal with good balance and coordination.  MSK:  Non tender with full range of motion and good stability and symmetric strength and tone of shoulders, elbows, wrist, hip, knee and ankles bilaterally.   Neck exam shows loss of lordosis.  Patient does not have any radiation.   Tightness left scapular region  No radiation no numbness, full strength   Osteopathic findings C6 flexed rotated and side bent left T3 extended rotated and side bent right inhaled third rib T5 extended rotated and side bent left L2 flexed rotated and side bent right Sacrum right on right     Impression and Recommendations:     This case required medical decision making of moderate complexity.      Note: This dictation was prepared with Dragon dictation along with smaller phrase technology. Any transcriptional errors that result from this process are unintentional.

## 2017-08-18 ENCOUNTER — Ambulatory Visit (INDEPENDENT_AMBULATORY_CARE_PROVIDER_SITE_OTHER): Payer: BC Managed Care – PPO | Admitting: Family Medicine

## 2017-08-18 ENCOUNTER — Encounter: Payer: Self-pay | Admitting: Family Medicine

## 2017-08-18 VITALS — BP 120/78 | HR 81 | Ht 67.0 in | Wt 145.0 lb

## 2017-08-18 DIAGNOSIS — G8929 Other chronic pain: Secondary | ICD-10-CM | POA: Diagnosis not present

## 2017-08-18 DIAGNOSIS — M999 Biomechanical lesion, unspecified: Secondary | ICD-10-CM

## 2017-08-18 DIAGNOSIS — M542 Cervicalgia: Secondary | ICD-10-CM

## 2017-08-18 NOTE — Assessment & Plan Note (Signed)
Decision today to treat with OMT was based on Physical Exam  After verbal consent patient was treated with HVLA, ME, FPR techniques in cervical, thoracic, lumbar and sacral areas  Patient tolerated the procedure well with improvement in symptoms  Patient given exercises, stretches and lifestyle modifications  See medications in patient instructions if given  Patient will follow up in 4 weeks 

## 2017-08-18 NOTE — Assessment & Plan Note (Signed)
Moderate OA discussed icing regimen discussed home exercises.  Topical anti-inflammatories.  With activities of doing which wants to avoid.

## 2017-08-18 NOTE — Patient Instructions (Signed)
Good to see you  Ice is you friend if needed Congrats on the move See me again in 6 weeks

## 2017-09-29 ENCOUNTER — Ambulatory Visit: Payer: BC Managed Care – PPO | Admitting: Family Medicine

## 2017-10-23 ENCOUNTER — Telehealth: Payer: Self-pay | Admitting: Family Medicine

## 2017-10-23 NOTE — Telephone Encounter (Signed)
Copied from CRM [id number]. Topic: Referral - Request >> Oct 23, 2017 10:06 AM Luanna Coleawoud, Jessica L wrote: Reason for CRM: pt would like referral to dr Merri Raybenjamin chasnis of  [facility]. 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

## 2017-10-23 NOTE — Telephone Encounter (Signed)
Referral faxed

## 2017-11-22 ENCOUNTER — Other Ambulatory Visit: Payer: Self-pay | Admitting: Family Medicine

## 2017-11-23 NOTE — Telephone Encounter (Signed)
Refill done.  

## 2018-04-06 ENCOUNTER — Telehealth: Payer: Self-pay | Admitting: *Deleted

## 2018-04-06 ENCOUNTER — Encounter: Payer: Self-pay | Admitting: Gynecology

## 2018-04-06 ENCOUNTER — Ambulatory Visit (INDEPENDENT_AMBULATORY_CARE_PROVIDER_SITE_OTHER): Payer: BC Managed Care – PPO | Admitting: Gynecology

## 2018-04-06 VITALS — BP 116/74 | Ht 67.0 in | Wt 146.0 lb

## 2018-04-06 DIAGNOSIS — Z01419 Encounter for gynecological examination (general) (routine) without abnormal findings: Secondary | ICD-10-CM

## 2018-04-06 DIAGNOSIS — K429 Umbilical hernia without obstruction or gangrene: Secondary | ICD-10-CM | POA: Diagnosis not present

## 2018-04-06 NOTE — Telephone Encounter (Signed)
Patient scheduled on 04/12/18 @ 10:20am with Dr. Manus Rudd

## 2018-04-06 NOTE — Progress Notes (Signed)
    Bianca Walton Nov 18, 1970 [id number]        48 y.o.  G2P1011 for annual gynecologic exam.  Also complaining of umbilical hernia of approximately 1 years duration.  Notices bulging with straining.  Not particularly painful.  Has been doing a lot of heavy lifting over the past 2 years.  Otherwise doing well without gynecologic complaints.  Past medical history,surgical history, problem list, medications, allergies, family history and social history were all reviewed and documented as reviewed in the EPIC chart.  ROS:  Performed with pertinent positives and negatives included in the history, assessment and plan.   Additional significant findings : None   Exam: [name] assistant Vitals:   04/06/18 0911  BP: 116/74  Weight: 146 lb (66.2 kg)  Height: 5\' 7"  (1.702 m)   Body mass index is 22.87 kg/m.  General appearance:  Normal affect, orientation and appearance. Skin: Grossly normal HEENT: Without gross lesions.  No cervical or supraclavicular adenopathy. Thyroid normal.  Lungs:  Clear without wheezing, rales or rhonchi Cardiac: RR, without RMG Abdominal:  Soft, nontender, without masses, guarding, rebound, organomegaly.  Small umbilical defect noted without significant herniation with straining. Breasts:  Examined lying and sitting without masses, retractions, discharge or axillary adenopathy. Pelvic:  Ext, BUS, Vagina: Normal  Cervix: Normal  Uterus: Anteverted, normal size, shape and contour, midline and mobile nontender   Adnexa: Without masses or tenderness    Anus and perineum: Normal   Rectovaginal: Normal sphincter tone without palpated masses or tenderness.    Assessment/Plan:  48 y.o. G32P1011 female for annual gynecologic exam.  With regular menses, abstinent contraception  1. Small umbilical hernia.  She does have a history of laparoscopy 2017.  The small defect feels to be above the surgical site.  Patient requesting referral to consider correction.  Will refer  to general surgery. 2. Contraception.  Patient currently abstinent.  Will follow-up if contraception becomes an issue. 3. Mammography due now and I reminded patient to schedule.  Breast exam normal today. 4. Pap smear/HPV 2016.  No Pap smear done today.  No history of abnormal Pap smears previously.  Plan repeat Pap smear/HPV next year at 5-year interval per current screening guidelines. 5. Health maintenance.  Written orders given for CBC, CMP and lipid profile that she is going to have done at [facility].  She will call me a week after having it drawn to make sure I saw the results.  Follow-up in 1 year, sooner as needed.   [name] MD, 9:52 AM 04/06/2018

## 2018-04-06 NOTE — Telephone Encounter (Signed)
Referral placed in [place] at [facility] they will call to schedule. 

## 2018-04-06 NOTE — Patient Instructions (Signed)
General surgery office should call you to schedule an appointment in reference to the umbilical hernia.  Call my office a week after having your fasting blood work done to make sure that I saw the results.

## 2018-04-06 NOTE — Telephone Encounter (Signed)
-----   Message from [name], MD sent at 04/06/2018  9:50 AM EST ----- General surgery referral reference umbilical hernia

## 2018-04-12 ENCOUNTER — Ambulatory Visit: Payer: Self-pay | Admitting: Surgery

## 2018-04-12 NOTE — H&P (Signed)
History of Present Illness Wilmon Arms(Jamilya Sarrazin K. Damione Robideau MD; 04/12/2018 11:03 AM) The patient is a 48 year old female who presents with an umbilical hernia. Referred by Dr. Chiquita Lothim Fontaine for umbilical hernia PCP - Dr. Larwance SachsBabaoff  This is a healthy 48 year old female who presents with approximately 18 months of a protruding bulge at her umbilicus. This bulge has become larger and more uncomfortable. It never reduces completely. When she first noticed this, there was some redness to this area but this has resolved. She denies any changes in her bowel habits. She presents now to discuss repair of her umbilical hernia. She has had a previous laparoscopy through an infra-umbilical incision. The bulging is above her umbilicus and does not seem to involve her previous incision.   Past Surgical History (Tanisha A. Manson PasseyBrown, RMA; 04/12/2018 10:37 AM) Oral Surgery Tonsillectomy  Diagnostic Studies History (Tanisha A. Manson PasseyBrown, RMA; 04/12/2018 10:37 AM) Colonoscopy never Mammogram 1-3 years ago Pap Smear 1-5 years ago  Allergies (Tanisha A. Manson PasseyBrown, RMA; 04/12/2018 10:38 AM) No Known Drug Allergies [04/12/2018]: Allergies Reconciled  Medication History (Tanisha A. Manson PasseyBrown, RMA; 04/12/2018 10:39 AM) DULoxetine HCl (20MG  Capsule DR Part, Oral) Active. Gabapentin (100MG  Tablet, Oral) Active. traZODone HCl (50MG  Tablet, Oral) Active. Medications Reconciled  Social History (Tanisha A. Manson PasseyBrown, RMA; 04/12/2018 10:37 AM) Alcohol use Occasional alcohol use. Caffeine use Coffee, Tea. No drug use Tobacco use Former smoker.  Family History (Tanisha A. Manson PasseyBrown, RMA; 04/12/2018 10:37 AM) Diabetes Mellitus Family Members In General, Father. Melanoma Mother. Respiratory Condition Family Members In General.  Pregnancy / Birth History (Tanisha A. Manson PasseyBrown, RMA; 04/12/2018 10:37 AM) Age at menarche 16 years. Gravida 2 Length (months) of breastfeeding >24 Maternal age 47-35 Para 1 Regular periods  Other  Problems (Tanisha A. Manson PasseyBrown, RMA; 04/12/2018 10:37 AM) Anxiety Disorder Back Pain Other disease, cancer, significant illness Umbilical Hernia Repair     Review of Systems (Tanisha A. Brown RMA; 04/12/2018 10:37 AM) General Not Present- Appetite Loss, Chills, Fatigue, Fever, Night Sweats, Weight Gain and Weight Loss. Skin Not Present- Change in Wart/Mole, Dryness, Hives, Jaundice, New Lesions, Non-Healing Wounds, Rash and Ulcer. HEENT Not Present- Earache, Hearing Loss, Hoarseness, Nose Bleed, Oral Ulcers, Ringing in the Ears, Seasonal Allergies, Sinus Pain, Sore Throat, Visual Disturbances, Wears glasses/contact lenses and Yellow Eyes. Respiratory Not Present- Bloody sputum, Chronic Cough, Difficulty Breathing, Snoring and Wheezing. Breast Not Present- Breast Mass, Breast Pain, Nipple Discharge and Skin Changes. Cardiovascular Not Present- Chest Pain, Difficulty Breathing Lying Down, Leg Cramps, Palpitations, Rapid Heart Rate, Shortness of Breath and Swelling of Extremities. Gastrointestinal Present- Abdominal Pain. Not Present- Bloating, Bloody Stool, Change in Bowel Habits, Chronic diarrhea, Constipation, Difficulty Swallowing, Excessive gas, Gets full quickly at meals, Hemorrhoids, Indigestion, Nausea, Rectal Pain and Vomiting. Female Genitourinary Not Present- Frequency, Nocturia, Painful Urination, Pelvic Pain and Urgency. Musculoskeletal Present- Back Pain and Joint Stiffness. Not Present- Joint Pain, Muscle Pain, Muscle Weakness and Swelling of Extremities. Neurological Not Present- Decreased Memory, Fainting, Headaches, Numbness, Seizures, Tingling, Tremor, Trouble walking and Weakness. Psychiatric Not Present- Anxiety, Bipolar, Change in Sleep Pattern, Depression, Fearful and Frequent crying. Endocrine Not Present- Cold Intolerance, Excessive Hunger, Hair Changes, Heat Intolerance, Hot flashes and New Diabetes. Hematology Not Present- Blood Thinners, Easy Bruising, Excessive bleeding,  Gland problems, HIV and Persistent Infections.  Vitals (Tanisha A. Brown RMA; 04/12/2018 10:38 AM) 04/12/2018 10:38 AM Weight: 149.6 lb Height: 67in Body Surface Area: 1.79 m Body Mass Index: 23.43 kg/m  Temp.: 98.95F  Pulse: 86 (Regular)  BP: 118/82 (Sitting, Left  Arm, Standard)      Physical Exam Molli Hazard K. Katheleen Stella MD; 04/12/2018 11:04 AM)  The physical exam findings are as follows: Note:WDWN in NAD Eyes: Pupils equal, round; sclera anicteric HENT: Oral mucosa moist; good dentition Neck: No masses palpated, no thyromegaly Lungs: CTA bilaterally; normal respiratory effort CV: Regular rate and rhythm; no murmurs; extremities well-perfused with no edema Abd: +bowel sounds, soft, non-tender, no palpable organomegaly; visible umbilical hernia at the upper edge of the umbilicus - enlarges with Valsalva, partially reducible Well-healed infraumbilical incision Skin: Warm, dry; no sign of jaundice Psychiatric - alert and oriented x 4; calm mood and affect    Assessment & Plan Molli Hazard K. Jahmari Esbenshade MD; 04/12/2018 10:57 AM)  UMBILICAL HERNIA WITHOUT OBSTRUCTION OR GANGRENE (K42.9)  Current Plans Schedule for Surgery - Umbilical hernia repair with mesh. The surgical procedure has been discussed with the patient. Potential risks, benefits, alternative treatments, and expected outcomes have been explained. All of the patient's questions at this time have been answered. The likelihood of reaching the patient's treatment goal is good. The patient understand the proposed surgical procedure and wishes to proceed.  Wilmon Arms. Corliss Skains, MD, [facility]  General/ Trauma Surgery Beeper [phone]  04/12/2018 11:04 AM

## 2018-05-17 ENCOUNTER — Ambulatory Visit: Payer: BC Managed Care – PPO | Admitting: Podiatry

## 2018-05-17 ENCOUNTER — Other Ambulatory Visit: Payer: Self-pay

## 2018-05-17 ENCOUNTER — Encounter: Payer: Self-pay | Admitting: Podiatry

## 2018-05-17 VITALS — BP 116/86 | HR 75

## 2018-05-17 DIAGNOSIS — M216X1 Other acquired deformities of right foot: Secondary | ICD-10-CM

## 2018-05-17 DIAGNOSIS — Q828 Other specified congenital malformations of skin: Secondary | ICD-10-CM

## 2018-05-17 NOTE — Progress Notes (Signed)
This patient presents to the office with a painful callus right foot.She says the callus is painful walking and wearing her shoes.  She says she was seen by a dermatologist who diagnosed her as having a corn and recommended she use acid on the callus.  She says the pain worsened with acid usage.  She has provided no self treatment.  She desires evaluation and treatment.    Vascular  Dorsalis pedis and posterior tibial pulses are palpable  B/L.  Capillary return  WNL.  Temperature gradient is  WNL.  Skin turgor  WNL  Sensorium  Senn Weinstein monofilament wire  WNL. Normal tactile sensation.  Nail Exam  Patient has normal nails with no evidence of bacterial or fungal infection.  Orthopedic  Exam  Muscle tone and muscle strength  WNL.  No limitations of motion feet  B/L.  No crepitus or joint effusion noted.  Foot type is unremarkable and digits show no abnormalities.  Plantar flexed fourth metatarsal right foot.  Skin  No open lesions.  Normal skin texture and turgor. Localized porokeratosis sub 4 right foot with white tissue due to sal acid.  Porokeratosis secondary to plantar flexed metatarsal right foot.    IE.  Debride white necrotic tissue .  Discussed condition with patient.  Padding was applied to insole.   RTC 4 weeks for continued evaluation and treatment.   Helane Gunther DPM

## 2018-07-14 ENCOUNTER — Other Ambulatory Visit: Payer: Self-pay

## 2018-07-14 ENCOUNTER — Other Ambulatory Visit: Payer: Self-pay | Admitting: Gynecology

## 2018-07-14 ENCOUNTER — Telehealth: Payer: Self-pay | Admitting: *Deleted

## 2018-07-14 NOTE — Telephone Encounter (Signed)
Spoke with patient and she notes LMP: 1/20, I explained she need to schedule visit with provider, transferred to appointment desk.

## 2018-07-14 NOTE — Telephone Encounter (Signed)
If patient is having regular menses then unlikely that her FSH would be elevated as she is producing enough estrogen to have a withdrawal bleed.

## 2018-07-14 NOTE — Telephone Encounter (Signed)
Patient went to have [facility] to have labs drawn from visit on 04/06/18, reports lately having menopausal symptoms asked if she could have approval to check [facility] level at [facility]? Please advise

## 2018-07-15 ENCOUNTER — Encounter: Payer: Self-pay | Admitting: Gynecology

## 2018-07-15 ENCOUNTER — Ambulatory Visit: Payer: BC Managed Care – PPO | Admitting: Gynecology

## 2018-07-15 VITALS — BP 118/74

## 2018-07-15 DIAGNOSIS — N912 Amenorrhea, unspecified: Secondary | ICD-10-CM

## 2018-07-15 LAB — CBC WITH DIFFERENTIAL/PLATELET
Basophils Absolute: 0.1 10*3/uL (ref 0.0–0.2)
Basos: 1 %
EOS (ABSOLUTE): 0.2 10*3/uL (ref 0.0–0.4)
Eos: 4 %
Hematocrit: 39.6 % (ref 34.0–46.6)
Hemoglobin: 13.7 g/dL (ref 11.1–15.9)
Immature Grans (Abs): 0 10*3/uL (ref 0.0–0.1)
Immature Granulocytes: 0 %
Lymphocytes Absolute: 2 10*3/uL (ref 0.7–3.1)
Lymphs: 41 %
MCH: 31.4 pg (ref 26.6–33.0)
MCHC: 34.6 g/dL (ref 31.5–35.7)
MCV: 91 fL (ref 79–97)
Monocytes Absolute: 0.5 10*3/uL (ref 0.1–0.9)
Monocytes: 11 %
Neutrophils Absolute: 2.1 10*3/uL (ref 1.4–7.0)
Neutrophils: 43 %
Platelets: 237 10*3/uL (ref 150–450)
RBC: 4.37 x10E6/uL (ref 3.77–5.28)
RDW: 11.8 % (ref 11.7–15.4)
WBC: 4.9 10*3/uL (ref 3.4–10.8)

## 2018-07-15 LAB — COMPREHENSIVE METABOLIC PANEL
ALT: 17 IU/L (ref 0–32)
AST: 17 IU/L (ref 0–40)
Albumin/Globulin Ratio: 2.6 — ABNORMAL HIGH (ref 1.2–2.2)
Albumin: 4.7 g/dL (ref 3.8–4.8)
Alkaline Phosphatase: 53 IU/L (ref 39–117)
BUN/Creatinine Ratio: 19 (ref 9–23)
BUN: 16 mg/dL (ref 6–24)
Bilirubin Total: 0.2 mg/dL (ref 0.0–1.2)
CO2: 26 mmol/L (ref 20–29)
Calcium: 9.5 mg/dL (ref 8.7–10.2)
Chloride: 101 mmol/L (ref 96–106)
Creatinine, Ser: 0.84 mg/dL (ref 0.57–1.00)
GFR calc Af Amer: 96 mL/min/{1.73_m2} (ref >59)
GFR calc non Af Amer: 84 mL/min/{1.73_m2} (ref >59)
Globulin, Total: 1.8 g/dL (ref 1.5–4.5)
Glucose: 104 mg/dL — ABNORMAL HIGH (ref 65–99)
Potassium: 4.5 mmol/L (ref 3.5–5.2)
Sodium: 142 mmol/L (ref 134–144)
Total Protein: 6.5 g/dL (ref 6.0–8.5)

## 2018-07-15 LAB — LIPID PANEL W/O CHOL/HDL RATIO
Cholesterol, Total: 215 mg/dL — ABNORMAL HIGH (ref 100–199)
HDL: 73 mg/dL (ref >39)
LDL Calculated: 107 mg/dL — ABNORMAL HIGH (ref 0–99)
Triglycerides: 175 mg/dL — ABNORMAL HIGH (ref 0–149)
VLDL Cholesterol Cal: 35 mg/dL (ref 5–40)

## 2018-07-15 LAB — SPECIMEN STATUS REPORT

## 2018-07-15 LAB — PREGNANCY, URINE: Preg Test, Ur: NEGATIVE

## 2018-07-15 NOTE — Progress Notes (Signed)
    Bianca Walton [birth year] [id number]        48 y.o.  G2P1011 presents with a history of regular menses through January.  She has had no bleeding since other than a day or 2 of spotting in March.  Has also developed increasing fatigue, hot flushes and sweats.  Currently not sexually active.  Having some weight gain but notes that she is not as active due to the COVID restrictions.  No hair or skin changes.  No breast tenderness or galactorrhea.  Past medical history,surgical history, problem list, medications, allergies, family history and social history were all reviewed and documented in the EPIC chart.  Directed ROS with pertinent positives and negatives documented in the history of present illness/assessment and plan.  Exam: [name] assistant Vitals:   07/15/18 0923  BP: 118/74   General appearance:  Normal Abdomen soft nontender without masses guarding rebound Pelvic external BUS vagina normal.  Cervix normal.  Uterus normal size midline mobile nontender.  Adnexa without masses or tenderness.  Assessment/Plan:  48 y.o. G2P1011 with amenorrhea since January.  We discussed the differential with amenorrhea to include pregnancy, anovulation/irregular ovulation and early menopause.  She is having symptoms to suggest low estrogen to include fatigue hot flushes and sweats.  We also discussed non-GYN possibilities to include thyroid dysfunction and hyperprolactinemia although she has no history of breast tenderness or galactorrhea.  Having some weight gain that she attributes to COVID restrictions but no significant hair or skin changes.  Will check baseline UPT for completeness as well as FSH and TSH.  We discussed if premature menopause the issues of early menopause and accelerated bone loss and cardiovascular risk.  We reviewed options to include HRT for symptom relief and prevention of the bone loss/cardiovascular risks.  I discussed with involved with HRT to include estrogen/progesterone  replacement and the risks to include thrombosis such as stroke heart attack DVT and breast cancer.  Options to include oral versus transdermal.  At this point would suggest starting on oral estradiol and Prometrium if her [facility] is elevated and she agrees to this.  If FSH is normal and it looks like oligoovulation then will proceed with progesterone challenge and then monitor afterwards.  If irregular bleeding continues/symptoms such as hot flushes and sweats will consider low-dose oral contraceptives for menstrual regulation, hormone replacement.  Risks of low-dose oral contraceptives are also discussed to include thrombosis.  I spent a total of 25 face-to-face minutes with the patient, over 50% was spent counseling and coordination of care.   [name] MD, 9:46 AM 07/15/2018

## 2018-07-15 NOTE — Patient Instructions (Signed)
Office will follow-up with you with the blood test results and we will formulate a game plan.

## 2018-07-16 ENCOUNTER — Other Ambulatory Visit: Payer: Self-pay | Admitting: Gynecology

## 2018-07-16 ENCOUNTER — Encounter: Payer: Self-pay | Admitting: Gynecology

## 2018-07-16 LAB — PROLACTIN: Prolactin: 10 ng/mL

## 2018-07-16 LAB — FOLLICLE STIMULATING HORMONE: FSH: 76.3 m[IU]/mL

## 2018-07-16 LAB — TSH: TSH: 2.36 mIU/L

## 2018-07-16 MED ORDER — ESTRADIOL 1 MG PO TABS: 1.0000 mg | ORAL_TABLET | Freq: Every day | ORAL | 1 refills | Status: DC

## 2018-07-16 MED ORDER — PROGESTERONE MICRONIZED 100 MG PO CAPS: 100.0000 mg | ORAL_CAPSULE | Freq: Every day | ORAL | 1 refills | Status: DC

## 2018-07-19 ENCOUNTER — Telehealth: Payer: Self-pay | Admitting: *Deleted

## 2018-07-19 ENCOUNTER — Encounter: Payer: Self-pay | Admitting: Gynecology

## 2018-07-19 ENCOUNTER — Other Ambulatory Visit: Payer: Self-pay | Admitting: Gynecology

## 2018-07-19 DIAGNOSIS — R7309 Other abnormal glucose: Secondary | ICD-10-CM

## 2018-07-19 DIAGNOSIS — R102 Pelvic and perineal pain: Secondary | ICD-10-CM

## 2018-07-19 DIAGNOSIS — E7800 Pure hypercholesterolemia, unspecified: Secondary | ICD-10-CM

## 2018-07-19 NOTE — Telephone Encounter (Signed)
Patient was seen on 07/15/18, called today asking if pelvic ultrasound could be done to r/o ovarian cyst and to make sure everything is "normal internally". Patient reports having history of ovarian cyst and did not mention at the visit, lower pelvic throbbing discomfort , no intense pain, but is daily. Please advise

## 2018-07-19 NOTE — Telephone Encounter (Signed)
Left detailed message for patient to call and schedule ultrasound appointment.

## 2018-07-19 NOTE — Telephone Encounter (Signed)
Order placed, will have appointment to call and schedule.

## 2018-07-19 NOTE — Telephone Encounter (Signed)
Ultrasound scheduled on 07/22/18

## 2018-07-19 NOTE — Telephone Encounter (Signed)
Okay to schedule ultrasound 

## 2018-07-20 ENCOUNTER — Other Ambulatory Visit: Payer: Self-pay

## 2018-07-22 ENCOUNTER — Ambulatory Visit (INDEPENDENT_AMBULATORY_CARE_PROVIDER_SITE_OTHER): Payer: BC Managed Care – PPO

## 2018-07-22 ENCOUNTER — Other Ambulatory Visit: Payer: Self-pay

## 2018-07-22 ENCOUNTER — Ambulatory Visit: Payer: BC Managed Care – PPO | Admitting: Gynecology

## 2018-07-22 ENCOUNTER — Encounter: Payer: Self-pay | Admitting: Gynecology

## 2018-07-22 VITALS — BP 116/76

## 2018-07-22 DIAGNOSIS — R102 Pelvic and perineal pain: Secondary | ICD-10-CM

## 2018-07-22 DIAGNOSIS — E28319 Asymptomatic premature menopause: Secondary | ICD-10-CM | POA: Diagnosis not present

## 2018-07-22 DIAGNOSIS — Z79890 Hormone replacement therapy (postmenopausal): Secondary | ICD-10-CM | POA: Diagnosis not present

## 2018-07-22 MED ORDER — ESTRADIOL 1 MG PO TABS: 1.0000 mg | ORAL_TABLET | Freq: Every day | ORAL | 11 refills | Status: DC

## 2018-07-22 MED ORDER — PROGESTERONE MICRONIZED 100 MG PO CAPS: 100.0000 mg | ORAL_CAPSULE | Freq: Every day | ORAL | 11 refills | Status: AC

## 2018-07-22 NOTE — Addendum Note (Signed)
Addended by: [name] on: 07/22/2018 02:21 PM   Modules accepted: Orders

## 2018-07-22 NOTE — Progress Notes (Signed)
    NELAH CORSINI Jan 31, 1971 [id number]        48 y.o.  G2P1011 presents for ultrasound.  Recently evaluated for amenorrhea and found to be in premature menopause with FSH at 77.  Started on HRT of estradiol 1 mg and Prometrium 100 mg 2 days ago.  Was having pelvic cramping and some discomfort.  She has a past history of ovarian cysts and was concerned and called and we scheduled her for an ultrasound for pelvic surveillance and clearance.  Past medical history,surgical history, problem list, medications, allergies, family history and social history were all reviewed and documented in the EPIC chart.  Directed ROS with pertinent positives and negatives documented in the history of present illness/assessment and plan.  Exam: Vitals:   07/22/18 1150  BP: 116/76   General appearance:  Normal Abdomen soft nontender without masses guarding rebound  Ultrasound transvaginal shows uterus normal size and echotexture.  Endometrial echo 5.7 mm.  Right and left ovaries visualized and normal.  Cul-de-sac negative.  Assessment/Plan:  48 y.o. G2P1011 with normal pelvic ultrasound.  Started on HRT 2 days ago.  Abdominal exam is normal.  Suspect pelvic discomfort GI related.  She is not having significant diarrhea or constipation.  No urinary symptoms.  Recommend observation for now and follow-up if it continues.  Otherwise she will continue on her HRT and call me if she has any issues with this.  We again discussed the risks versus benefits to include thrombosis in the breast cancer issue.  Benefits particularly in a premature menopause situation to include symptom relief as well as cardiovascular and bone health also reviewed.    [name] MD, 12:09 PM 07/22/2018

## 2018-07-22 NOTE — Patient Instructions (Signed)
Continue on the hormone replacement.  Call if you have any questions at all.

## 2018-08-05 ENCOUNTER — Telehealth: Payer: Self-pay | Admitting: *Deleted

## 2018-08-05 NOTE — Telephone Encounter (Signed)
Patient is on week 3 of HRT estrace 1 mg and progesterone 100 cap and has been bleeding x 3 days now, flow is moderate to heavy, LMP:03/11/18.  Patient wanted to know if this could be related to starting HRT? Please advise

## 2018-08-05 NOTE — Telephone Encounter (Signed)
Left detailed message on cell per patient request.

## 2018-08-05 NOTE — Telephone Encounter (Signed)
Unusual but sometimes this can happen.  I would stay on the HRT for now.  If the bleeding would continue then we will pursue a more involved evaluation.

## 2018-10-05 ENCOUNTER — Institutional Professional Consult (permissible substitution): Payer: BC Managed Care – PPO | Admitting: Pulmonary Disease

## 2018-10-12 ENCOUNTER — Institutional Professional Consult (permissible substitution): Payer: BC Managed Care – PPO | Admitting: Pulmonary Disease

## 2018-10-18 ENCOUNTER — Ambulatory Visit (INDEPENDENT_AMBULATORY_CARE_PROVIDER_SITE_OTHER): Payer: BC Managed Care – PPO | Admitting: Internal Medicine

## 2018-10-18 ENCOUNTER — Other Ambulatory Visit: Payer: Self-pay | Admitting: Internal Medicine

## 2018-10-18 ENCOUNTER — Encounter: Payer: Self-pay | Admitting: Internal Medicine

## 2018-10-18 ENCOUNTER — Other Ambulatory Visit: Payer: Self-pay

## 2018-10-18 DIAGNOSIS — J4520 Mild intermittent asthma, uncomplicated: Secondary | ICD-10-CM

## 2018-10-18 MED ORDER — ALBUTEROL SULFATE HFA 108 (90 BASE) MCG/ACT IN AERS: 2.0000 | INHALATION_SPRAY | Freq: Four times a day (QID) | RESPIRATORY_TRACT | 10 refills | Status: DC | PRN

## 2018-10-18 MED ORDER — DULERA 100-5 MCG/ACT IN AERO: 2.0000 | INHALATION_SPRAY | Freq: Two times a day (BID) | RESPIRATORY_TRACT | 10 refills | Status: DC

## 2018-10-18 NOTE — Telephone Encounter (Signed)
Lets do ADVAIR HFA 115 2 puffs twice daily

## 2018-10-18 NOTE — Progress Notes (Signed)
Name: Bianca Walton MRN: [id number] DOB: [birth year]     I connected with the patient by telephone enabled telemedicine visit and verified that I am speaking with the correct person using two identifiers.    I discussed the limitations, risks, security and privacy concerns of performing an evaluation and management service by telemedicine and the availability of in-person appointments. I also discussed with the patient that there may be a patient responsible charge related to this service. The patient expressed understanding and agreed to proceed.  PATIENT AGREES AND CONFIRMS -YES   Other persons participating in the visit and their role in the encounter: Patient, nursing   Patient's location: Home Provider's location: Clinic   I discussed the limitations, risks, security and privacy concerns of performing an evaluation and management service by telephone and the availability of in person appointments. I also discussed with the patient that there may be a patient responsible charge related to this service. The patient expressed understanding and agreed to proceed.  This visit type was conducted due to national recommendations for restrictions regarding the COVID-19 Pandemic (e.g. social distancing).  This format is felt to be most appropriate for this patient at this time.  All issues noted in this document were discussed and addressed.      CONSULTATION DATE: 10/18/2018  REFERRING MD : Jenne CampusmcQueen  CHIEF COMPLAINT: ASTHMA   HISTORY OF PRESENT ILLNESS: 48 year old female with tele-visit for assessment of asthma patient was diagnosed with asthma 20 years ago she has been getting inhaler therapy with Dulera and Ventolin from ENT  Patient has a history of sinus surgery and chronic allergic rhinitis and she uses intranasal steroids and nasal pots  At this time patient does not seem to have any type of asthma exacerbation She is allergic to cats dogs grass pollen mold dust mites and  roaches  She is a Runner, broadcasting/film/videoteacher  Last time she used prednisone and antibiotics was 2 years ago She responds well to that therapy  At this time her asthma is under control  Another concern she has that she has increased shortness of breath and dyspnea on exertion with exercise I have advised her to use Ventolin prior to exercise  She is an ex-smoker 1 to 2 cigarettes/day for 15 years She weighs 155 pounds   AST MEDICAL HISTORY :   has a past medical history of Allergy, Anxiety, Cyst, ovarian, and Depression.  has a past surgical history that includes Nasal sinus surgery (01/2010); Wisdom tooth extraction (2000); Tonsillectomy (1993); and Laparoscopic ovarian cystectomy (Right, 01/22/2016). Prior to Admission medications   Medication Sig Start Date End Date Taking? Authorizing Provider  DULoxetine (CYMBALTA) 20 MG capsule Take 20 mg by mouth daily.    [provider]  estradiol (ESTRACE) 1 MG tablet Take 1 tablet (1 mg total) by mouth daily. 07/22/18   Fontaine, Nadyne Coombesimothy P, MD  ibuprofen (ADVIL,MOTRIN) 200 MG tablet Take 400 mg by mouth every 6 (six) hours as needed for headache, mild pain or moderate pain.    [provider]  Magnesium Gluconate 550 MG TABS Take by mouth.    [provider]  Multiple Vitamins-Minerals (MULTIVITAMIN ADULT PO) Take by mouth.    [provider]  Nutritional Supplements (JUICE PLUS FIBRE PO) Take 6 each by mouth daily.    [provider]  progesterone (PROMETRIUM) 100 MG capsule Take 1 capsule (100 mg total) by mouth at bedtime. 07/22/18   Fontaine, Nadyne Coombesimothy P, MD  traZODone (DESYREL) 50 MG tablet  Take 50 mg by mouth at bedtime.    [provider]  tretinoin (RETIN-A) 0.05 % cream Apply topically 3 (three) times a week.    [provider]   No Known Allergies  FAMILY HISTORY:  family history includes Cancer in her maternal grandmother and mother; Diabetes in her maternal grandfather and maternal uncle;  Heart disease in her maternal grandfather and paternal grandfather; Hyperlipidemia in her father and mother; Stroke in her maternal grandfather and paternal grandfather. SOCIAL HISTORY:  reports that she quit smoking about 13 years ago. She has never used smokeless tobacco. She reports current alcohol use. She reports that she does not use drugs.    Review of Systems:  Gen:  Denies  fever, sweats, chills weigh loss  HEENT: Denies blurred vision, double vision, ear pain, eye pain, hearing loss, nose bleeds, sore throat Cardiac:  No dizziness, chest pain or heaviness, chest tightness,edema, No JVD Resp:   No cough, -sputum production, -shortness of breath,-wheezing, -hemoptysis,  Gi: Denies swallowing difficulty, stomach pain, nausea or vomiting, diarrhea, constipation, bowel incontinence Gu:  Denies bladder incontinence, burning urine Ext:   Denies Joint pain, stiffness or swelling Skin: Denies  skin rash, easy bruising or bleeding or hives Endoc:  Denies polyuria, polydipsia , polyphagia or weight change Psych:   Denies depression, insomnia or hallucinations  Other:  All other systems negative     MEDICATIONS: I have reviewed all medications and confirmed regimen as documented   IMAGING     ASSESSMENT AND PLAN SYNOPSIS  Asthma mild intermittent-well-controlled at this time She uses Dulera as needed when she has chest congestion Ventolin as needed Avoid triggers No indication for prednisone or antibiotics at this time No indication of asthma exacerbation at this time We will obtain pulmonary function testing to assess for underlying obstructive airways disease  Chronic allergic rhinitis Continue intranasal steroids and parts as per ENT No signs of sinus infection at this time  Exercise-induced bronchospasms Recommend using Ventolin 2 to 3 puffs 15 minutes prior to exercise  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  The importance of social distancing was discussed today. Hand Washing Techniques and avoid touching face was advised.  MEDICATION ADJUSTMENTS/LABS AND TESTS ORDERED: START DULERA  VENTOLIN AS NEEDED  USE VENTOLIN 2-3 puffs prior to exercise  Obtain pulmonary function testing CURRENT MEDICATIONS REVIEWED AT LENGTH WITH PATIENT TODAY   Patient satisfied with Plan of action and management. All questions answered  Follow up in 6 months  Total time spent 34 minutes   Corrin Parker, M.D.  [facility]  Medical Director [place] Director [facility] Cardio-Pulmonary Department

## 2018-10-18 NOTE — Patient Instructions (Addendum)
START DULERA  VENTOLIN AS NEEDED  USE VENTOLIN 2-3 puffs prior to exercise    Asthma, Adult  Asthma is a long-term (chronic) condition in which the airways get tight and narrow. The airways are the breathing passages that lead from the nose and mouth down into the lungs. A person with asthma will have times when symptoms get worse. These are called asthma attacks. They can cause coughing, whistling sounds when you breathe (wheezing), shortness of breath, and chest pain. They can make it hard to breathe. There is no cure for asthma, but medicines and lifestyle changes can help control it. There are many things that can bring on an asthma attack or make asthma symptoms worse (triggers). Common triggers include:  Mold.  Dust.  Cigarette smoke.  Cockroaches.  Things that can cause allergy symptoms (allergens). These include animal skin flakes (dander) and pollen from trees or grass.  Things that pollute the air. These may include household cleaners, wood smoke, smog, or chemical odors.  Cold air, weather changes, and wind.  Crying or laughing hard.  Stress.  Certain medicines or drugs.  Certain foods such as dried fruit, potato chips, and grape juice.  Infections, such as a cold or the flu.  Certain medical conditions or diseases.  Exercise or tiring activities. Asthma may be treated with medicines and by staying away from the things that cause asthma attacks. Types of medicines may include:  Controller medicines. These help prevent asthma symptoms. They are usually taken every day.  Fast-acting reliever or rescue medicines. These quickly relieve asthma symptoms. They are used as needed and provide short-term relief.  Allergy medicines if your attacks are brought on by allergens.  Medicines to help control the body's defense (immune) system. Follow these instructions at home: Avoiding triggers in your home  Change your heating and air conditioning filter often.   Limit your use of fireplaces and wood stoves.  Get rid of pests (such as roaches and mice) and their droppings.  Throw away plants if you see mold on them.  Clean your floors. Dust regularly. Use cleaning products that do not smell.  Have someone vacuum when you are not home. Use a vacuum cleaner with a HEPA filter if possible.  Replace carpet with wood, tile, or vinyl flooring. Carpet can trap animal skin flakes and dust.  Use allergy-proof pillows, mattress covers, and box spring covers.  Wash bed sheets and blankets every week in hot water. Dry them in a dryer.  Keep your bedroom free of any triggers.  Avoid pets and keep windows closed when things that cause allergy symptoms are in the air.  Use blankets that are made of polyester or cotton.  Clean bathrooms and kitchens with bleach. If possible, have someone repaint the walls in these rooms with mold-resistant paint. Keep out of the rooms that are being cleaned and painted.  Wash your hands often with soap and water. If soap and water are not available, use hand sanitizer.  Do not allow anyone to smoke in your home. General instructions  Take over-the-counter and prescription medicines only as told by your doctor. ? Talk with your doctor if you have questions about how or when to take your medicines. ? Make note if you need to use your medicines more often than usual.  Do not use any products that contain nicotine or tobacco, such as cigarettes and e-cigarettes. If you need help quitting, ask your doctor.  Stay away from secondhand smoke.  Avoid doing things  outdoors when allergen counts are high and when air quality is low.  Wear a ski mask when doing outdoor activities in the winter. The mask should cover your nose and mouth. Exercise indoors on cold days if you can.  Warm up before you exercise. Take time to cool down after exercise.  Use a peak flow meter as told by your doctor. A peak flow meter is a tool that  measures how well the lungs are working.  Keep track of the peak flow meter's readings. Write them down.  Follow your asthma action plan. This is a written plan for taking care of your asthma and treating your attacks.  Make sure you get all the shots (vaccines) that your doctor recommends. Ask your doctor about a flu shot and a pneumonia shot.  Keep all follow-up visits as told by your doctor. This is important. Contact a doctor if:  You have wheezing, shortness of breath, or a cough even while taking medicine to prevent attacks.  The mucus you cough up (sputum) is thicker than usual.  The mucus you cough up changes from clear or white to yellow, green, gray, or bloody.  You have problems from the medicine you are taking, such as: ? A rash. ? Itching. ? Swelling. ? Trouble breathing.  You need reliever medicines more than 2-3 times a week.  Your peak flow reading is still at 50-79% of your personal best after following the action plan for 1 hour.  You have a fever. Get help right away if:  You seem to be worse and are not responding to medicine during an asthma attack.  You are short of breath even at rest.  You get short of breath when doing very little activity.  You have trouble eating, drinking, or talking.  You have chest pain or tightness.  You have a fast heartbeat.  Your lips or fingernails start to turn blue.  You are light-headed or dizzy, or you faint.  Your peak flow is less than 50% of your personal best.  You feel too tired to breathe normally. Summary  Asthma is a long-term (chronic) condition in which the airways get tight and narrow. An asthma attack can make it hard to breathe.  Asthma cannot be cured, but medicines and lifestyle changes can help control it.  Make sure you understand how to avoid triggers and how and when to use your medicines. This information is not intended to replace advice given to you by your health care provider. Make  sure you discuss any questions you have with your health care provider. Document Released: 08/06/2007 Document Revised: 04/22/2018 Document Reviewed: 03/24/2016 Elsevier Patient Education  2020 [employer]

## 2018-10-21 ENCOUNTER — Telehealth: Payer: Self-pay | Admitting: Internal Medicine

## 2018-10-21 NOTE — Telephone Encounter (Signed)
Pt aware that forms were completed and faxed to provided fax number on 10/20/2018. Nothing further is needed at this time.

## 2018-11-15 ENCOUNTER — Other Ambulatory Visit: Payer: Self-pay

## 2018-11-18 ENCOUNTER — Telehealth: Payer: Self-pay | Admitting: *Deleted

## 2018-11-18 MED ORDER — LO LOESTRIN FE 1 MG-10 MCG / 10 MCG PO TABS: ORAL_TABLET | ORAL | 3 refills | Status: DC

## 2018-11-18 NOTE — Telephone Encounter (Signed)
My recommendation would be to go on a low-dose oral contraceptives to override the system for now.  Risks of low-dose pills are low to include thrombosis such as stroke heart attack DVT.  She does have a history of smoking but had quit 14 years ago which I think makes at risk low.  We could start her on the lowest dose birth control pill out there the LoLoestrin 1/10.  She could take these continuously and see how she does.  I would recommend staying on them for 1 year and then we can try backing down to a lower dose HRT regiment.

## 2018-11-18 NOTE — Telephone Encounter (Signed)
Patient informed with below note, Rx sent. Patient asked if I could fax order to [facility] [phone] for repeat lipid profile, A1C, glucose. This was done , patient aware she will need to call and figure out a time frame to have labs drawn

## 2018-11-18 NOTE — Telephone Encounter (Signed)
Patient called to follow up with telephone call on 08/05/18. Patient called to update has been having cycles every 2-4 weeks while taking HRT, bleeding last at least 3-5 days, heavy x 2 days,then moderate flow. Cycle started again last night. Patient was told to call if bleeding should continue. Please advise

## 2018-11-22 ENCOUNTER — Telehealth: Payer: Self-pay

## 2018-11-22 ENCOUNTER — Other Ambulatory Visit: Payer: Self-pay

## 2018-11-22 NOTE — Telephone Encounter (Signed)
Pt is aware of date/time for covid and voiced her understanding.

## 2018-11-23 ENCOUNTER — Encounter: Payer: Self-pay | Admitting: Gynecology

## 2018-11-23 ENCOUNTER — Telehealth: Payer: Self-pay

## 2018-11-23 ENCOUNTER — Other Ambulatory Visit: Admission: RE | Admit: 2018-11-23 | Payer: BC Managed Care – PPO | Source: Ambulatory Visit

## 2018-11-23 NOTE — Telephone Encounter (Signed)
Per our records, it does not appear that pt showed for covid test on 11/23/2018.  Left message to confirm this with pt prior to canceling PFT.

## 2018-11-23 NOTE — Telephone Encounter (Signed)
First available appointment for PFT at [facility] is 02/17/2019 at 8:15. Pt is aware of appointment, however, she has asked if the [facility] can get her in for PFT first appointment in the morning sooner than December.  Phone message sent to [place] [facility] in [place]. Rhonda J [place]

## 2018-11-23 NOTE — Telephone Encounter (Signed)
Spoke to pt, who confirmed that she was not able to have covid test, due to meeting at work. Rhonda please reschedule PFT. Thanks

## 2018-11-24 ENCOUNTER — Ambulatory Visit: Payer: BC Managed Care – PPO

## 2018-11-24 NOTE — Telephone Encounter (Signed)
Scheduled pft on 01/03/2019-pr

## 2018-12-05 ENCOUNTER — Other Ambulatory Visit: Payer: Self-pay | Admitting: Internal Medicine

## 2018-12-05 DIAGNOSIS — J4520 Mild intermittent asthma, uncomplicated: Secondary | ICD-10-CM

## 2018-12-09 ENCOUNTER — Encounter: Payer: Self-pay | Admitting: Gynecology

## 2018-12-15 ENCOUNTER — Telehealth: Payer: Self-pay | Admitting: Internal Medicine

## 2018-12-15 ENCOUNTER — Encounter: Payer: Self-pay | Admitting: Gynecology

## 2018-12-15 NOTE — Telephone Encounter (Signed)
PFT for [place] cancelled per patient request. R/S for [place] location. Nothing further needed at this time.

## 2018-12-15 NOTE — Telephone Encounter (Signed)
Pt wanted to R/S PFT to be done at [facility].  Scheduled PFT for Tues 03/08/2019 at 8:00 arrival time at [facility].  Appointment information as well as instructions mailed to patient.  Pt requested the PFT scheduled in the [facility] to be canceled. Will route message to Triage in [place] to cancel PFT on 01/03/2019 along with the [place]

## 2018-12-15 NOTE — Telephone Encounter (Signed)
Will route to Rhonda.  

## 2018-12-30 ENCOUNTER — Other Ambulatory Visit: Payer: BC Managed Care – PPO

## 2018-12-31 ENCOUNTER — Other Ambulatory Visit: Payer: BC Managed Care – PPO

## 2019-02-17 ENCOUNTER — Ambulatory Visit: Payer: BC Managed Care – PPO

## 2019-03-02 ENCOUNTER — Telehealth: Payer: Self-pay | Admitting: Internal Medicine

## 2019-03-02 NOTE — Telephone Encounter (Signed)
Lm to relay date/time of covid test. 03/07/2019 prior to 11:[facility].

## 2019-03-03 NOTE — Telephone Encounter (Addendum)
Pt is aware of date/time of covid date.  Offered pt an appt, as she does not currently have an appt scheduled.  Pt did not wish to schedule an appt at this time.  Nothing further is needed.

## 2019-03-07 ENCOUNTER — Other Ambulatory Visit: Admission: RE | Admit: 2019-03-07 | Payer: BC Managed Care – PPO | Source: Ambulatory Visit

## 2019-03-07 ENCOUNTER — Telehealth: Payer: Self-pay | Admitting: Internal Medicine

## 2019-03-07 NOTE — Telephone Encounter (Addendum)
Called and spoke to pt, who is requesting to cancel PFT for 03/08/2019. I have contacted Britta Mccreedy and requested that PFT be canceled.  Pt is aware that PFT's are scheduling a ways out. Pt was okay with this.  Nothing further is needed.

## 2019-03-08 ENCOUNTER — Ambulatory Visit: Payer: BC Managed Care – PPO

## 2019-05-30 ENCOUNTER — Other Ambulatory Visit: Payer: Self-pay | Admitting: Neurosurgery

## 2019-05-30 DIAGNOSIS — M545 Low back pain, unspecified: Secondary | ICD-10-CM

## 2019-05-30 DIAGNOSIS — M5412 Radiculopathy, cervical region: Secondary | ICD-10-CM

## 2019-06-12 ENCOUNTER — Other Ambulatory Visit: Payer: Self-pay

## 2019-06-12 ENCOUNTER — Ambulatory Visit
Admission: RE | Admit: 2019-06-12 | Discharge: 2019-06-12 | Disposition: A | Discharge status: Home / Self Care | Payer: BC Managed Care – PPO | Source: Ambulatory Visit | Attending: Neurosurgery | Admitting: Neurosurgery

## 2019-06-12 DIAGNOSIS — M545 Low back pain, unspecified: Secondary | ICD-10-CM

## 2019-06-12 DIAGNOSIS — M5412 Radiculopathy, cervical region: Secondary | ICD-10-CM | POA: Diagnosis not present

## 2019-06-27 ENCOUNTER — Other Ambulatory Visit: Payer: BC Managed Care – PPO

## 2019-10-25 ENCOUNTER — Other Ambulatory Visit: Payer: Self-pay | Admitting: Physician Assistant

## 2019-10-25 DIAGNOSIS — IMO0001 Reserved for inherently not codable concepts without codable children: Secondary | ICD-10-CM

## 2019-11-11 ENCOUNTER — Ambulatory Visit: Payer: BC Managed Care – PPO

## 2019-11-12 ENCOUNTER — Other Ambulatory Visit: Payer: Self-pay

## 2019-11-12 ENCOUNTER — Ambulatory Visit
Admission: RE | Admit: 2019-11-12 | Discharge: 2019-11-12 | Disposition: A | Discharge status: Home / Self Care | Payer: BC Managed Care – PPO | Source: Ambulatory Visit | Attending: Physician Assistant | Admitting: Physician Assistant

## 2019-11-12 DIAGNOSIS — H9042 Sensorineural hearing loss, unilateral, left ear, with unrestricted hearing on the contralateral side: Secondary | ICD-10-CM | POA: Insufficient documentation

## 2019-11-12 DIAGNOSIS — IMO0001 Reserved for inherently not codable concepts without codable children: Secondary | ICD-10-CM

## 2019-11-12 MED ORDER — GADOBUTROL 1 MMOL/ML IV SOLN
7.0000 mL | Freq: Once | INTRAVENOUS | Status: AC | PRN
  Administered 2019-11-12: 7 mL via INTRAVENOUS

## 2019-12-27 ENCOUNTER — Other Ambulatory Visit: Payer: Self-pay | Admitting: Urology

## 2019-12-27 DIAGNOSIS — N281 Cyst of kidney, acquired: Secondary | ICD-10-CM

## 2020-01-10 ENCOUNTER — Other Ambulatory Visit: Payer: Self-pay | Admitting: Urology

## 2020-01-10 DIAGNOSIS — N281 Cyst of kidney, acquired: Secondary | ICD-10-CM

## 2020-01-12 ENCOUNTER — Ambulatory Visit: Payer: BC Managed Care – PPO

## 2020-01-14 ENCOUNTER — Ambulatory Visit: Payer: BC Managed Care – PPO

## 2020-01-17 ENCOUNTER — Telehealth: Payer: Self-pay | Admitting: Internal Medicine

## 2020-01-17 NOTE — Telephone Encounter (Signed)
Yes, I did. 

## 2020-01-17 NOTE — Telephone Encounter (Signed)
Deliana called saying that she talked to Dr. Maryruth Bun and that you would take her on as a new pt

## 2020-01-17 NOTE — Telephone Encounter (Signed)
Will you schedule a new patient appt.

## 2020-01-18 NOTE — Telephone Encounter (Signed)
Lm on vm to schedule new patient appointment with Dr. Darrick Huntsman.

## 2020-01-19 ENCOUNTER — Other Ambulatory Visit: Payer: Self-pay

## 2020-01-19 ENCOUNTER — Ambulatory Visit
Admission: RE | Admit: 2020-01-19 | Discharge: 2020-01-19 | Disposition: A | Discharge status: Home / Self Care | Payer: BC Managed Care – PPO | Source: Ambulatory Visit | Attending: Urology | Admitting: Urology

## 2020-01-19 DIAGNOSIS — N281 Cyst of kidney, acquired: Secondary | ICD-10-CM

## 2020-01-19 MED ORDER — IOHEXOL 300 MG/ML  SOLN
100.0000 mL | Freq: Once | INTRAMUSCULAR | Status: AC | PRN
  Administered 2020-01-19: 100 mL via INTRAVENOUS

## 2020-03-23 ENCOUNTER — Ambulatory Visit: Payer: BC Managed Care – PPO | Admitting: Internal Medicine

## 2020-03-27 ENCOUNTER — Encounter: Payer: Self-pay | Admitting: *Deleted

## 2020-04-20 ENCOUNTER — Ambulatory Visit: Payer: BC Managed Care – PPO | Admitting: Internal Medicine

## 2020-04-20 ENCOUNTER — Encounter: Payer: Self-pay | Admitting: Internal Medicine

## 2020-04-20 ENCOUNTER — Other Ambulatory Visit: Payer: Self-pay

## 2020-04-20 VITALS — BP 108/64 | HR 87 | Temp 98.2°F | Resp 14 | Ht 67.0 in | Wt 142.0 lb

## 2020-04-20 DIAGNOSIS — R5383 Other fatigue: Secondary | ICD-10-CM

## 2020-04-20 DIAGNOSIS — G8929 Other chronic pain: Secondary | ICD-10-CM

## 2020-04-20 DIAGNOSIS — G5601 Carpal tunnel syndrome, right upper limb: Secondary | ICD-10-CM

## 2020-04-20 DIAGNOSIS — Z1231 Encounter for screening mammogram for malignant neoplasm of breast: Secondary | ICD-10-CM | POA: Diagnosis not present

## 2020-04-20 DIAGNOSIS — R635 Abnormal weight gain: Secondary | ICD-10-CM

## 2020-04-20 DIAGNOSIS — M542 Cervicalgia: Secondary | ICD-10-CM

## 2020-04-20 DIAGNOSIS — M545 Low back pain, unspecified: Secondary | ICD-10-CM

## 2020-04-20 NOTE — Progress Notes (Signed)
Subjective:  Patient ID: Bianca Walton, female    DOB: 10-08-1970  Age: 50 y.o. MRN: [id number]  CC: The primary encounter diagnosis was Encounter for screening mammogram for malignant neoplasm of breast. Diagnoses of Weight gain, Fatigue, unspecified type, Chronic bilateral low back pain without sciatica, Chronic neck pain, and Carpal tunnel syndrome of right wrist were also pertinent to this visit.  HPI Bianca Walton presents for establishment of care, referred by DR Maryruth Bun.  Transitioning from [employer].    This visit occurred during the SARS-CoV-2 public health emergency.  Safety protocols were in place, including screening questions prior to the visit, additional usage of staff PPE, and extensive cleaning of exam room while observing appropriate contact time as indicated for disinfecting solutions.   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  Sees Dr Maryruth Bun. Taking celexa 30 mg daily and vyvanse for ADD with dose reduced for side effect of painful jaw clenching   2) chronic cervical and lumbar disk disease , managed with massage and sauna every 2 weeks, prefers to avoid medications and surgery.  Her pain was aggravated by falling down a full flight of stairs on [place] 2020,  As well as years of doing her  Landscaping and lifting tons of rocks . Has seen Orthopedics in the past but [employer] retiring,  Has pain and numbness radiating to left arm/hand followed by right hand /  Had EMG  Studies showing Carpal tunnel on the right,  Disk issues causing the left sided symptoms . Does not take anything  but advil 400  Mg prn and a muscle relaxer at night when pain is severe.     3) ADD : symptoms have been present but untreated since high school; diagnosed until last year by comprehensive exam .  Dr Maryruth Bun prescribing Vyvanse.   4) Had PAP smear last year  5) history of ovarian  cyst removed .  Not malignant.  6 Umbilical hernia repaired with mesh   7) saw Urologist in [place]  For abnormal MRI suggesting a mass on kidney. False alarm    8) menopause  On HRT since May 2020  9) diagnostic colonoscopy  Fall 2021 by Dr. Loreta Ave: hemorrhoids .  Surgery offered but deferred.    History Bianca Walton has a past medical history of Allergy, Anxiety, Cyst, ovarian, and Depression.   She has a past surgical history that includes Nasal sinus surgery (01/2010); Wisdom tooth extraction (2000); Tonsillectomy (1993); Laparoscopic ovarian cystectomy (Right, 01/22/2016); and Hernia repair.   Her family history includes Cancer in her maternal grandmother and mother; Depression in her mother; Diabetes in her father, maternal grandfather, and maternal uncle; Heart disease in her maternal grandfather and paternal grandfather; Hyperlipidemia in her father and mother; Hypertension in her father; Stroke in her maternal grandfather and paternal grandfather.She reports that she quit smoking about 15 years ago. She has never used smokeless tobacco. She reports current alcohol use. She reports that she does not use drugs.  Outpatient Medications Prior to Visit  Medication Sig Dispense Refill  . albuterol (VENTOLIN HFA) 108 (90 Base) MCG/ACT inhaler Inhale 2 puffs into the lungs every 6 (six) hours as needed for wheezing or shortness of breath. 1 g 10  . fluticasone-salmeterol (ADVAIR HFA) 115-21 MCG/ACT inhaler Inhale 2 puffs into the lungs 2 (two) times daily. 1 Inhaler 2  . Multiple Vitamins-Minerals (  MULTIVITAMIN ADULT PO) Take by mouth.    . Nutritional Supplements (JUICE PLUS FIBRE PO) Take 6 each by mouth daily.    . progesterone (PROMETRIUM) 100 MG capsule Take 1 capsule (100 mg total) by mouth at bedtime. (Patient taking differently: Take 100 mg by mouth at bedtime. Takes 1 capsule on Monday, Wednesday, and Friday.) 30 capsule 11  . traZODone (DESYREL) 50 MG tablet Take 50 mg by mouth at bedtime.  Takes 1/2 tablet(25 mg) by mouth at bedtime.    . citalopram (CELEXA) 10 MG tablet Take 10 mg by mouth daily.    . citalopram (CELEXA) 20 MG tablet Take 20 mg by mouth daily.    Marland Kitchen estradiol (VIVELLE-DOT) 0.1 MG/24HR patch Place 1 patch onto the skin 2 (two) times a week.    Marland Kitchen VYVANSE 20 MG capsule Take 20 mg by mouth every morning.    . DULoxetine (CYMBALTA) 20 MG capsule Take 20 mg by mouth daily. (Patient not taking: Reported on 04/20/2020)    . estradiol (ESTRACE) 1 MG tablet Take 1 tablet (1 mg total) by mouth daily. (Patient not taking: Reported on 04/20/2020) 30 tablet 11  . ibuprofen (ADVIL,MOTRIN) 200 MG tablet Take 400 mg by mouth every 6 (six) hours as needed for headache, mild pain or moderate pain. (Patient not taking: Reported on 04/20/2020)    . Magnesium Gluconate 550 MG TABS Take by mouth. (Patient not taking: Reported on 04/20/2020)    . Norethindrone-Ethinyl Estradiol-Fe Biphas (LO LOESTRIN FE) 1 MG-10 MCG / 10 MCG tablet Take one active pill by mouth daily continuously, skipping placebo pills. (Patient not taking: Reported on 04/20/2020) 3 Package 3  . tretinoin (RETIN-A) 0.05 % cream Apply topically 3 (three) times a week. (Patient not taking: Reported on 04/20/2020)     No facility-administered medications prior to visit.    Review of Systems:  Patient denies headache, fevers, malaise, unintentional weight loss, skin rash, eye pain, sinus congestion and sinus pain, sore throat, dysphagia,  hemoptysis , cough, dyspnea, wheezing, chest pain, palpitations, orthopnea, edema, abdominal pain, nausea, melena, diarrhea, constipation, flank pain, dysuria, hematuria, urinary  Frequency, nocturia, numbness, tingling, seizures,  Focal weakness, Loss of consciousness,  Tremor, insomnia, depression, anxiety, and suicidal ideation.     Objective:  BP 108/64 (BP Location: Left Arm, Patient Position: Sitting, Cuff Size: Normal)   Pulse 87   Temp 98.2 F (36.8 C) (Oral)   Resp 14   Ht 5\' 7"   (1.702 m)   Wt 142 lb (64.4 kg)   SpO2 97%   BMI 22.24 kg/m   Physical Exam:   General appearance: alert, cooperative and appears stated age Ears: normal TM's and external ear canals both ears Throat: lips, mucosa, and tongue normal; teeth and gums normal Neck: no adenopathy, no carotid bruit, supple, symmetrical, trachea midline and thyroid not enlarged, symmetric, no tenderness/mass/nodules Back: symmetric, no curvature. ROM normal. No CVA tenderness. Lungs: clear to auscultation bilaterally Heart: regular rate and rhythm, S1, S2 normal, no murmur, click, rub or gallop Abdomen: soft, non-tender; bowel sounds normal; no masses,  no organomegaly Pulses: 2+ and symmetric Skin: Skin color, texture, turgor normal. No rashes or lesions Lymph nodes: Cervical, supraclavicular, and axillary nodes normal.  Assessment & Plan:   Problem List Items Addressed This Visit      Unprioritized   Carpal tunnel syndrome of right wrist    By EMG studies done by Orthopedics       Relevant Medications   citalopram (CELEXA) 10  MG tablet   citalopram (CELEXA) 20 MG tablet   VYVANSE 20 MG capsule   Chronic back pain    Managed with massage and sauna done on a biweekly basis,       Relevant Medications   citalopram (CELEXA) 10 MG tablet   citalopram (CELEXA) 20 MG tablet   Chronic neck pain    Managed with massage and sauna done biweekly.  She has disk disease and left sided radiculopathy without loss of strength.       Relevant Medications   citalopram (CELEXA) 10 MG tablet   citalopram (CELEXA) 20 MG tablet    Other Visit Diagnoses    Encounter for screening mammogram for malignant neoplasm of breast    -  Primary   Relevant Orders   MM 3D SCREEN BREAST BILATERAL   Weight gain       Relevant Orders   Lipid panel (Completed)   Comprehensive metabolic panel (Completed)   TSH (Completed)   Fatigue, unspecified type       Relevant Orders   CBC with Differential/Platelet (Completed)       I have discontinued Rosalita Chessman L. Barto's ibuprofen, tretinoin, DULoxetine, Magnesium Gluconate, and Lo Loestrin Fe. I am also having her maintain her Nutritional Supplements (JUICE PLUS FIBRE PO), traZODone, Multiple Vitamins-Minerals (MULTIVITAMIN ADULT PO), progesterone, albuterol, Advair HFA, citalopram, citalopram, estradiol, and Vyvanse.  No orders of the defined types were placed in this encounter.   Medications Discontinued During This Encounter  Medication Reason  . DULoxetine (CYMBALTA) 20 MG capsule   . estradiol (ESTRACE) 1 MG tablet Error  . ibuprofen (ADVIL,MOTRIN) 200 MG tablet   . Magnesium Gluconate 550 MG TABS   . Norethindrone-Ethinyl Estradiol-Fe Biphas (LO LOESTRIN FE) 1 MG-10 MCG / 10 MCG tablet   . tretinoin (RETIN-A) 0.05 % cream     Follow-up: Return in about 6 months (around 10/18/2020).   Sherlene Shams, MD

## 2020-04-20 NOTE — Patient Instructions (Addendum)
Welcome!   Your annual mammogram has been ordered.  You are encouraged (required) to call to make your appointment at [facility]  [phone]

## 2020-04-21 LAB — CBC WITH DIFFERENTIAL/PLATELET
Absolute Monocytes: 614 cells/uL (ref 200–950)
Basophils Absolute: 51 cells/uL (ref 0–200)
Basophils Relative: 0.8 %
Eosinophils Absolute: 179 cells/uL (ref 15–500)
Eosinophils Relative: 2.8 %
HCT: 39.1 % (ref 35.0–45.0)
Hemoglobin: 13.5 g/dL (ref 11.7–15.5)
Lymphs Abs: 2413 cells/uL (ref 850–3900)
MCH: 32 pg (ref 27.0–33.0)
MCHC: 34.5 g/dL (ref 32.0–36.0)
MCV: 92.7 fL (ref 80.0–100.0)
MPV: 10.2 fL (ref 7.5–12.5)
Monocytes Relative: 9.6 %
Neutro Abs: 3142 cells/uL (ref 1500–7800)
Neutrophils Relative %: 49.1 %
Platelets: 248 10*3/uL (ref 140–400)
RBC: 4.22 10*6/uL (ref 3.80–5.10)
RDW: 12.1 % (ref 11.0–15.0)
Total Lymphocyte: 37.7 %
WBC: 6.4 10*3/uL (ref 3.8–10.8)

## 2020-04-21 LAB — COMPREHENSIVE METABOLIC PANEL
AG Ratio: 2.4 (calc) (ref 1.0–2.5)
ALT: 17 U/L (ref 6–29)
AST: 16 U/L (ref 10–35)
Albumin: 4.5 g/dL (ref 3.6–5.1)
Alkaline phosphatase (APISO): 46 U/L (ref 31–125)
BUN: 19 mg/dL (ref 7–25)
CO2: 26 mmol/L (ref 20–32)
Calcium: 9 mg/dL (ref 8.6–10.2)
Chloride: 102 mmol/L (ref 98–110)
Creat: 0.83 mg/dL (ref 0.50–1.10)
Globulin: 1.9 g/dL (calc) (ref 1.9–3.7)
Glucose, Bld: 84 mg/dL (ref 65–99)
Potassium: 3.9 mmol/L (ref 3.5–5.3)
Sodium: 138 mmol/L (ref 135–146)
Total Bilirubin: 0.4 mg/dL (ref 0.2–1.2)
Total Protein: 6.4 g/dL (ref 6.1–8.1)

## 2020-04-21 LAB — LIPID PANEL
Cholesterol: 216 mg/dL — ABNORMAL HIGH (ref <200)
HDL: 87 mg/dL (ref >=50)
LDL Cholesterol (Calc): 114 mg/dL (calc) — ABNORMAL HIGH
Non-HDL Cholesterol (Calc): 129 mg/dL (calc) (ref <130)
Total CHOL/HDL Ratio: 2.5 (calc) (ref <5.0)
Triglycerides: 65 mg/dL (ref <150)

## 2020-04-21 LAB — TSH: TSH: 2.47 mIU/L

## 2020-04-22 DIAGNOSIS — G5601 Carpal tunnel syndrome, right upper limb: Secondary | ICD-10-CM | POA: Insufficient documentation

## 2020-04-22 NOTE — Assessment & Plan Note (Signed)
By EMG studies done by Orthopedics

## 2020-04-22 NOTE — Assessment & Plan Note (Signed)
Managed with massage and sauna done biweekly.  She has disk disease and left sided radiculopathy without loss of strength.

## 2020-04-22 NOTE — Assessment & Plan Note (Signed)
Managed with massage and sauna done on a biweekly basis,

## 2020-06-27 ENCOUNTER — Telehealth: Payer: Self-pay

## 2020-06-27 NOTE — Telephone Encounter (Signed)
Does pt need labs before her 6 month follow up in 10/2020?

## 2020-06-27 NOTE — Telephone Encounter (Signed)
Pt called and wants to know when she should get her cholesterol checked again. Please place orders and call pt to schedule lab. Pt states that you can leave a detailed message on her VM

## 2020-06-27 NOTE — Telephone Encounter (Signed)
She does not need labs before her august appointment

## 2020-06-28 NOTE — Telephone Encounter (Signed)
Spoke with pt to let her know that she does not need labs before her appt in August. Pt gave a verbal understanding.

## 2020-07-05 ENCOUNTER — Other Ambulatory Visit: Payer: Self-pay

## 2020-07-05 ENCOUNTER — Ambulatory Visit
Admission: RE | Admit: 2020-07-05 | Discharge: 2020-07-05 | Disposition: A | Discharge status: Home / Self Care | Payer: BC Managed Care – PPO | Source: Ambulatory Visit | Attending: Internal Medicine | Admitting: Internal Medicine

## 2020-07-05 DIAGNOSIS — Z1231 Encounter for screening mammogram for malignant neoplasm of breast: Secondary | ICD-10-CM | POA: Insufficient documentation

## 2020-07-06 ENCOUNTER — Inpatient Hospital Stay
Admission: RE | Admit: 2020-07-06 | Discharge: 2020-07-06 | Disposition: A | Discharge status: Home / Self Care | Payer: Self-pay | Source: Ambulatory Visit | Attending: *Deleted | Admitting: *Deleted

## 2020-07-06 ENCOUNTER — Other Ambulatory Visit: Payer: Self-pay | Admitting: *Deleted

## 2020-07-06 DIAGNOSIS — Z1231 Encounter for screening mammogram for malignant neoplasm of breast: Secondary | ICD-10-CM

## 2020-07-27 ENCOUNTER — Ambulatory Visit
Admission: EM | Admit: 2020-07-27 | Discharge: 2020-07-27 | Disposition: A | Discharge status: Home / Self Care | Payer: BC Managed Care – PPO | Attending: Emergency Medicine | Admitting: Emergency Medicine

## 2020-07-27 ENCOUNTER — Other Ambulatory Visit: Payer: Self-pay

## 2020-07-27 DIAGNOSIS — Z760 Encounter for issue of repeat prescription: Secondary | ICD-10-CM

## 2020-07-27 DIAGNOSIS — J0100 Acute maxillary sinusitis, unspecified: Secondary | ICD-10-CM

## 2020-07-27 MED ORDER — BENZONATATE 100 MG PO CAPS: 100.0000 mg | ORAL_CAPSULE | Freq: Three times a day (TID) | ORAL | 0 refills | Status: DC | PRN

## 2020-07-27 MED ORDER — AMOXICILLIN 875 MG PO TABS: 875.0000 mg | ORAL_TABLET | Freq: Two times a day (BID) | ORAL | 0 refills | Status: AC

## 2020-07-27 MED ORDER — ADVAIR HFA 115-21 MCG/ACT IN AERO: 2.0000 | INHALATION_SPRAY | Freq: Two times a day (BID) | RESPIRATORY_TRACT | 0 refills | Status: DC

## 2020-07-27 NOTE — ED Provider Notes (Signed)
Renaldo Fiddler    CSN: [id number] Arrival date & time: 07/27/20  1301      History   Chief Complaint Chief Complaint  Patient presents with  . Nasal Congestion    X 2 days      HPI Bianca Walton is a 50 y.o. female.   Patient presents with 2-day history of chest congestion and cough.  She states she has had postnasal drip, runny nose, sinus pressure, sinus congestion x2 weeks.  She reports history of frequent sinus infections and sinus surgery.  She denies fever, chills, shortness of breath, or other symptoms.  Patient declines COVID test.  Patient also requests a refill on her Advair HFA.  She states she is out of this medication and the pharmacy said she does not have refills.  Her medical history includes chronic back pain, chronic neck pain, allergic rhinitis, asthma, anxiety.  The history is provided by the patient and medical records.    Past Medical History:  Diagnosis Date  . Allergy    seasonal, pollen, molds, some food.  . Anxiety   . Cyst, ovarian   . Depression    prozac, wellbutrin    Patient Active Problem List   Diagnosis Date Noted  . 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 Nonallopathic lesion of cervical region 05/28/2017  . Nonallopathic lesion of thoracic region 05/28/2017  . Nonallopathic lesion of lumbar region 05/28/2017  . Chronic back pain 04/29/2017  . Chronic neck pain 04/29/2017  . Anxiety 05/27/2012  . Allergic rhinitis 05/27/2012  . Asthma in adult 05/27/2012    Past Surgical History:  Procedure Laterality Date  . HERNIA REPAIR    . LAPAROSCOPIC OVARIAN CYSTECTOMY Right 01/22/2016   Procedure: LAPAROSCOPIC RIGHT OVARIAN CYSTECTOMY;  Surgeon: [name], MD;  Location: [facility];  Service: Gynecology;  Laterality: Right;  . NASAL SINUS SURGERY  01/2010  . TONSILLECTOMY  1993  . WISDOM TOOTH EXTRACTION  2000   x 2    OB History    Gravida  2   Para  1   Term  1   Preterm      AB  1   Living  1      SAB  1   IAB      Ectopic  0   Multiple      Live Births               Home Medications    Prior to Admission medications   Medication Sig Start Date End Date Taking? Authorizing Provider  amoxicillin (AMOXIL) 875 MG tablet Take 1 tablet (875 mg total) by mouth 2 (two) times daily for 7 days. 07/27/20 08/03/20 Yes Mickie Bail, NP  benzonatate (TESSALON) 100 MG capsule Take 1 capsule (100 mg total) by mouth 3 (three) times daily as needed for cough. 07/27/20  Yes Mickie Bail, NP  fluticasone-salmeterol (ADVAIR HFA) 115-21 MCG/ACT inhaler Inhale 2 puffs into the lungs 2 (two) times daily. 07/27/20  Yes Mickie Bail, NP  albuterol (VENTOLIN HFA) 108 (90 Base) MCG/ACT inhaler Inhale 2 puffs into the lungs every 6 (six) hours as needed for wheezing or shortness of breath. 10/18/18   Erin Fulling, MD  citalopram (CELEXA) 10 MG tablet Take 10 mg by mouth daily. 02/16/20   [provider]  citalopram (CELEXA) 20 MG tablet Take 20 mg by mouth daily. 02/17/20   [provider]  estradiol (VIVELLE-DOT) 0.1 MG/24HR patch Place 1  patch onto the skin 2 (two) times a week. 02/28/20   [provider]  fluticasone-salmeterol (ADVAIR HFA) 115-21 MCG/ACT inhaler Inhale 2 puffs into the lungs 2 (two) times daily. 10/18/18   Erin Fulling, MD  Multiple Vitamins-Minerals (MULTIVITAMIN ADULT PO) Take by mouth.    [provider]  Nutritional Supplements (JUICE PLUS FIBRE PO) Take 6 each by mouth daily.    [provider]  progesterone (PROMETRIUM) 100 MG capsule Take 1 capsule (100 mg total) by mouth at bedtime. Patient taking differently: Take 100 mg by mouth at bedtime. Takes 1 capsule on Monday, Wednesday, and Friday. 07/22/18   Fontaine, Nadyne Coombes, MD  traZODone (DESYREL) 50 MG tablet Take 50 mg by mouth at bedtime. Takes 1/2 tablet(25 mg) by mouth at bedtime.    [provider]  VYVANSE 20 MG capsule Take 20 mg by mouth every morning. 04/10/20    [provider]  mometasone-formoterol (DULERA) 100-5 MCG/ACT AERO Inhale 2 puffs into the lungs 2 (two) times daily. 10/18/18 10/18/18  Erin Fulling, MD    Family History Family History  Problem Relation Age of Onset  . Cancer Mother        melanoma  . Hyperlipidemia Mother   . Depression Mother   . Hyperlipidemia Father   . Diabetes Father   . Hypertension Father   . Diabetes Maternal Uncle   . Stroke Maternal Grandfather   . Heart disease Maternal Grandfather   . Diabetes Maternal Grandfather   . Stroke Paternal Grandfather   . Heart disease Paternal Grandfather   . Cancer Maternal Grandmother        throat  . Breast cancer Neg Hx     Social History Social History   Tobacco Use  . Smoking status: Former Smoker    Quit date: 12/01/2004    Years since quitting: 15.6  . Smokeless tobacco: Never Used  Vaping Use  . Vaping Use: Never used  Substance Use Topics  . Alcohol use: Yes    Alcohol/week: 0.0 standard drinks    Comment: Very rare  . Drug use: No     Allergies   Patient has no known allergies.   Review of Systems Review of Systems  Constitutional: Negative for chills and fever.  HENT: Positive for congestion, postnasal drip, rhinorrhea and sinus pressure. Negative for ear pain and sore throat.   Respiratory: Positive for cough. Negative for shortness of breath.   Cardiovascular: Negative for chest pain and palpitations.  Gastrointestinal: Negative for abdominal pain, diarrhea and vomiting.  Skin: Negative for color change and rash.  All other systems reviewed and are negative.    Physical Exam Triage Vital Signs ED Triage Vitals  Enc Vitals Group     BP      Pulse      Resp      Temp      Temp src      SpO2      Weight      Height      Head Circumference      Peak Flow      Pain Score      Pain Loc      Pain Edu?      Excl. in GC?    No data found.  Updated Vital Signs BP 110/75 (BP Location: Left Arm)   Pulse 98   Resp 16    LMP 07/04/2020   SpO2 97%   Visual Acuity Right Eye Distance:   Left  Eye Distance:   Bilateral Distance:    Right Eye Near:   Left Eye Near:    Bilateral Near:     Physical Exam Vitals and nursing note reviewed.  Constitutional:      General: She is not in acute distress.    Appearance: She is well-developed. She is not ill-appearing.  HENT:     Head: Normocephalic and atraumatic.     Right Ear: Tympanic membrane normal.     Left Ear: Tympanic membrane normal.     Nose: Nose normal.     Mouth/Throat:     Mouth: Mucous membranes are moist.     Pharynx: Oropharynx is clear.  Eyes:     Conjunctiva/sclera: Conjunctivae normal.  Cardiovascular:     Rate and Rhythm: Normal rate and regular rhythm.     Heart sounds: Normal heart sounds.  Pulmonary:     Effort: Pulmonary effort is normal. No respiratory distress.     Breath sounds: Normal breath sounds.  Abdominal:     Palpations: Abdomen is soft.     Tenderness: There is no abdominal tenderness.  Musculoskeletal:     Cervical back: Neck supple.  Skin:    General: Skin is warm and dry.  Neurological:     General: No focal deficit present.     Mental Status: She is alert and oriented to person, place, and time.  Psychiatric:        Mood and Affect: Mood normal.        Behavior: Behavior normal.      UC Treatments / Results  Labs (all labs ordered are listed, but only abnormal results are displayed) Labs Reviewed - No data to display  EKG   Radiology No results found.  Procedures Procedures (including critical care time)  Medications Ordered in UC Medications - No data to display  Initial Impression / Assessment and Plan / UC Course  I have reviewed the triage vital signs and the nursing notes.  Pertinent labs & imaging results that were available during my care of the patient were reviewed by me and considered in my medical decision making (see chart for details).   Acute sinusitis, encounter for  medication refill.  Treating with amoxicillin, Tessalon Perles, refill on Advair HFA.  Instructed patient to follow-up with her PCP if her symptoms are not improving.  She declines COVID test today.  Instructed her to follow-up with her PCP or pulmonologist for additional refills on her Advair.  She agrees to plan of care.   Final Clinical Impressions(s) / UC Diagnoses   Final diagnoses:  Acute non-recurrent maxillary sinusitis  Encounter for medication refill     Discharge Instructions     Take the amoxicillin as directed.  See the attached information on sinus infections.  Take the [facility] as needed for cough.  Follow up with your primary care provider if your symptoms are not improving.    A one-time refill on your Advair was sent to your pharmacy.  Please see your primary care provider or pulmonologist for refills.       ED Prescriptions    Medication Sig Dispense Auth. Provider   fluticasone-salmeterol (ADVAIR HFA) 115-21 MCG/ACT inhaler Inhale 2 puffs into the lungs 2 (two) times daily. 1 each Mickie Bail, NP   amoxicillin (AMOXIL) 875 MG tablet Take 1 tablet (875 mg total) by mouth 2 (two) times daily for 7 days. 14 tablet Mickie Bail, NP   benzonatate (TESSALON) 100 MG capsule Take  1 capsule (100 mg total) by mouth 3 (three) times daily as needed for cough. 21 capsule Mickie Bailate, Emili Mcloughlin H, NP     I have reviewed the PDMP during this encounter.   Mickie Bailate, Sheetal Lyall H, NP 07/27/20 [phone]

## 2020-07-27 NOTE — Discharge Instructions (Signed)
Take the amoxicillin as directed.  See the attached information on sinus infections.  Take the [facility] as needed for cough.  Follow up with your primary care provider if your symptoms are not improving.    A one-time refill on your Advair was sent to your pharmacy.  Please see your primary care provider or pulmonologist for refills.

## 2020-07-27 NOTE — ED Triage Notes (Signed)
Patient presents to Urgent Care with complaints of nasal congestion, sinus pressure  x 2 days. Has a hx of sinus issues, bronchitis, and seasonal allergies. Treating symptoms with mucinex, sudafed, delsium, umcka, elderberry, b-12. Here for further eval since she is traveling out of town.   Denies fever.

## 2020-08-07 ENCOUNTER — Telehealth: Payer: Self-pay | Admitting: Internal Medicine

## 2020-08-07 NOTE — Telephone Encounter (Signed)
FYI

## 2020-08-07 NOTE — Telephone Encounter (Signed)
PT called to schedule a appt for a severe sinus infection that she has had for quite awhile. She has been to the Urgent Care but they meds they gave helped only a bit and it came back worse. PT was wanting to see Dr.Tullo but nothing was available so scheduled her with Marvell Fuller for a Thursday 11:00 Mychart Vid Visit.

## 2020-08-09 ENCOUNTER — Encounter: Payer: Self-pay | Admitting: Adult Health

## 2020-08-09 ENCOUNTER — Telehealth (INDEPENDENT_AMBULATORY_CARE_PROVIDER_SITE_OTHER): Payer: BC Managed Care – PPO | Admitting: Adult Health

## 2020-08-09 VITALS — Wt 132.0 lb

## 2020-08-09 DIAGNOSIS — J0141 Acute recurrent pansinusitis: Secondary | ICD-10-CM | POA: Insufficient documentation

## 2020-08-09 MED ORDER — ADVAIR HFA 115-21 MCG/ACT IN AERO
2.0000 | INHALATION_SPRAY | Freq: Two times a day (BID) | RESPIRATORY_TRACT | 0 refills | Status: DC
Start: 2020-08-09 — End: 2020-11-16

## 2020-08-09 MED ORDER — AMOXICILLIN-POT CLAVULANATE 875-125 MG PO TABS: 1.0000 | ORAL_TABLET | Freq: Two times a day (BID) | ORAL | 0 refills | Status: DC

## 2020-08-09 MED ORDER — PREDNISONE 10 MG (21) PO TBPK: ORAL_TABLET | ORAL | 0 refills | Status: DC

## 2020-08-09 NOTE — Progress Notes (Signed)
Virtual Visit via Video Note  I connected with Bianca Walton on 08/09/20 at 11:00 AM EDT by a video enabled telemedicine application and verified that I am speaking with the correct person using two identifiers. Parties involved in visit as below:    Location: Patient: at home  Provider: Provider: Provider's office at  [facility], [place].    I discussed the limitations of evaluation and management by telemedicine and the availability of in person appointments. The patient expressed understanding and agreed to proceed.  History of Present Illness: Patient was seen on 07/27/20 at urgent care for a 2 day onset of chest congestion and cough/ nasal congestion. She was prescribed Amoxicillin, Benzonatate, and refill given on Fluticasone- salmeterol as well as albuterol inhaler refilled. She reports she finished course.  Patient declined covid testing at Urgent Care.   She felt she was getting better a couple of days on the medicine, and she feels she just has not gotten over it.  Chest congestion has cleared, she has sinus pressure and pain. Yellow nasal congestion.  History of sinus surgery in the past, she has a history of sinus infection. She feels the medication did not clear it up.  She had done two covid test initially that were negative at home.   She is Runner, broadcasting/film/video.   Patient  denies any  body aches,chills, rash, chest pain, shortness of breath, nausea, vomiting, or diarrhea.     Observations/Objective   Patient is alert and oriented and responsive to questions Engages in conversation with provider. Speaks in full sentences without any pauses without any shortness of breath or distress.   Assessment and Plan:   Acute recurrent pansinusitis  Recommended testing for covid in [place] patient declined.  Will treat for sinusitis with Augmentin as she took Amoxicillin with partial clearing of symptoms.  Meds ordered this encounter  Medications    amoxicillin-clavulanate (AUGMENTIN) 875-125 MG tablet    Sig: Take 1 tablet by mouth 2 (two) times daily.    Dispense:  20 tablet    Refill:  0   predniSONE (STERAPRED UNI-PAK 21 TAB) 10 MG (21) TBPK tablet    Sig: PO: Take 6 tablets on day 1:Take 5 tablets day 2:Take 4 tablets day 3: Take 3 tablets day 4:Take 2 tablets day five: 5 Take 1 tablet day 6    Dispense:  21 tablet    Refill:  0   fluticasone-salmeterol (ADVAIR HFA) 115-21 MCG/ACT inhaler    Sig: Inhale 2 puffs into the lungs 2 (two) times daily.    Dispense:  1 each    Refill:  0     Follow Up Instructions:   Return in about 3 days (around 08/12/2020), or if symptoms worsen or fail to improve, for at any time for any worsening symptoms, Go to Emergency room/ urgent care if worse.   Advised in person evaluation at anytime is advised if any symptoms do not improve, worsen or change at any given time.  Red Flags discussed. The patient was given clear instructions to go to ER or return to medical center if any red flags develop, symptoms do not improve, worsen or new problems develop. They verbalized understanding.  I discussed the assessment and treatment plan with the patient. The patient was provided an opportunity to ask questions and all were answered. The patient agreed with the plan and demonstrated an understanding of the instructions.   The patient was advised to call back or seek  an in-person evaluation if the symptoms worsen or if the condition fails to improve as anticipated.    Jairo Ben, FNP

## 2020-08-09 NOTE — Patient Instructions (Signed)
Advised in person evaluation at anytime is advised if any symptoms do not improve, worsen or change at any given time.  Red Flags discussed. The patient was given clear instructions to go to ER or return to medical center if any red flags develop, symptoms do not improve, worsen or new problems develop. They verbalized understanding.    Sinusitis, Adult Sinusitis is soreness and swelling (inflammation) of your sinuses. Sinuses are hollow spaces in the bones around your face. They are located: Around your eyes. In the middle of your forehead. Behind your nose. In your cheekbones. Your sinuses and nasal passages are lined with a fluid called mucus. Mucus drains out of your sinuses. Swelling can trap mucus in your sinuses. This lets germs (bacteria, virus, or fungus) grow, which leads to infection. Most of the time, this condition is caused by a virus. What are the causes? This condition is caused by: Allergies. Asthma. Germs. Things that block your nose or sinuses. Growths in the nose (nasal polyps). Chemicals or irritants in the air. Fungus (rare). What increases the risk? You are more likely to develop this condition if: You have a weak body defense system (immune system). You do a lot of swimming or diving. You use nasal sprays too much. You smoke. What are the signs or symptoms? The main symptoms of this condition are pain and a feeling of pressure around the sinuses. Other symptoms include: Stuffy nose (congestion). Runny nose (drainage). Swelling and warmth in the sinuses. Headache. Toothache. A cough that may get worse at night. Mucus that collects in the throat or the back of the nose (postnasal drip). Being unable to smell and taste. Being very tired (fatigue). A fever. Sore throat. Bad breath. How is this diagnosed? This condition is diagnosed based on: Your symptoms. Your medical history. A physical exam. Tests to find out if your condition is short-term (acute)  or long-term (chronic). Your doctor may: Check your nose for growths (polyps). Check your sinuses using a tool that has a light (endoscope). Check for allergies or germs. Do imaging tests, such as an MRI or CT scan. How is this treated? Treatment for this condition depends on the cause and whether it is short-term or long-term. If caused by a virus, your symptoms should go away on their own within 10 days. You may be given medicines to relieve symptoms. They include: Medicines that shrink swollen tissue in the nose. Medicines that treat allergies (antihistamines). A spray that treats swelling of the nostrils.  Rinses that help get rid of thick mucus in your nose (nasal saline washes). If caused by bacteria, your doctor may wait to see if you will get better without treatment. You may be given antibiotic medicine if you have: A very bad infection. A weak body defense system. If caused by growths in the nose, you may need to have surgery. Follow these instructions at home: Medicines Take, use, or apply over-the-counter and prescription medicines only as told by your doctor. These may include nasal sprays. If you were prescribed an antibiotic medicine, take it as told by your doctor. Do not stop taking the antibiotic even if you start to feel better. Hydrate and humidify Drink enough water to keep your pee (urine) pale yellow. Use a cool mist humidifier to keep the humidity level in your home above 50%. Breathe in steam for 10-15 minutes, 3-4 times a day, or as told by your doctor. You can do this in the bathroom while a hot shower is running.  Try not to spend time in cool or dry air.   Rest Rest as much as you can. Sleep with your head raised (elevated). Make sure you get enough sleep each night. General instructions Put a warm, moist washcloth on your face 3-4 times a day, or as often as told by your doctor. This will help with discomfort. Wash your hands often with soap and water. If  there is no soap and water, use hand sanitizer. Do not smoke. Avoid being around people who are smoking (secondhand smoke). Keep all follow-up visits as told by your doctor. This is important.   Contact a doctor if: You have a fever. Your symptoms get worse. Your symptoms do not get better within 10 days. Get help right away if: You have a very bad headache. You cannot stop throwing up (vomiting). You have very bad pain or swelling around your face or eyes. You have trouble seeing. You feel confused. Your neck is stiff. You have trouble breathing. Summary Sinusitis is swelling of your sinuses. Sinuses are hollow spaces in the bones around your face. This condition is caused by tissues in your nose that become inflamed or swollen. This traps germs. These can lead to infection. If you were prescribed an antibiotic medicine, take it as told by your doctor. Do not stop taking it even if you start to feel better. Keep all follow-up visits as told by your doctor. This is important. This information is not intended to replace advice given to you by your health care provider. Make sure you discuss any questions you have with your health care provider. Document Revised: 07/20/2017 Document Reviewed: 07/20/2017 Elsevier Patient Education  2021 Elsevier Inc. Amoxicillin; Clavulanic Acid Tablets What is this medicine? AMOXICILLIN; CLAVULANIC ACID (a mox i SIL in; KLAV yoo lan ic AS id) is a penicillin antibiotic. It treats some infections caused by bacteria. It will not work for colds, the flu, or other viruses. This medicine may be used for other purposes; ask your health care provider or pharmacist if you have questions. COMMON BRAND NAME(S): Augmentin What should I tell my health care provider before I take this medicine? They need to know if you have any of these conditions: kidney disease liver disease mononucleosis stomach or intestine problems such as colitis an unusual or allergic  reaction to amoxicillin, other penicillin or cephalosporin antibiotics, clavulanic acid, other medicines, foods, dyes, or preservatives pregnant or trying to get pregnant breast-feeding How should I use this medicine? Take this drug by mouth. Take it as directed on the prescription label at the same time every day. Take it with food at the start of a meal or snack. Take all of this drug unless your health care provider tells you to stop it early. Keep taking it even if you think you are better. Talk to your health care provider about the use of this drug in children. While it may be prescribed for selected conditions, precautions do apply. Overdosage: If you think you have taken too much of this medicine contact a poison control center or emergency room at once. NOTE: This medicine is only for you. Do not share this medicine with others. What if I miss a dose? If you miss a dose, take it as soon as you can. If it is almost time for your next dose, take only that dose. Do not take double or extra doses. What may interact with this medicine? allopurinol anticoagulants birth control pills methotrexate probenecid This list may not describe  all possible interactions. Give your health care provider a list of all the medicines, herbs, non-prescription drugs, or dietary supplements you use. Also tell them if you smoke, drink alcohol, or use illegal drugs. Some items may interact with your medicine. What should I watch for while using this medicine? Tell your health care provider if your symptoms do not start to get better or if they get worse. This medicine may cause serious skin reactions. They can happen weeks to months after starting the medicine. Contact your health care provider right away if you notice fevers or flu-like symptoms with a rash. The rash may be red or purple and then turn into blisters or peeling of the skin. Or, you might notice a red rash with swelling of the face, lips or lymph  nodes in your neck or under your arms. Do not treat diarrhea with over the counter products. Contact your health care provider if you have diarrhea that lasts more than 2 days or if it is severe and watery. If you have diabetes, you may get a false-positive result for sugar in your urine. Check with your health care provider. Birth control may not work properly while you are taking this medicine. Talk to your health care provider about using an extra method of birth control. What side effects may I notice from receiving this medicine? Side effects that you should report to your doctor or health care provider as soon as possible: allergic reactions (skin rash, itching or hives; swelling of the face, lips, or tongue) bloody or watery diarrhea dark urine infection (fever, chills, cough, sore throat, or pain) kidney injury (trouble passing urine or change in the amount of urine) redness, blistering, peeling, or loosening of the skin, including inside the mouth seizures thrush (white patches in the mouth or mouth sores) trouble breathing unusual bruising or bleeding unusually weak or tired Side effects that usually do not require medical attention (report to your doctor or health care provider if they continue or are bothersome): diarrhea dizziness headache nausea, vomiting unusual vaginal discharge, itching, or odor upset stomach This list may not describe all possible side effects. Call your doctor for medical advice about side effects. You may report side effects to FDA at 1-800-FDA-1088. Where should I keep my medicine? Keep out of the reach of children and pets. Store at room temperature between 20 and 25 degrees C (68 and 77 degrees F). Throw away any unused drug after the expiration date. NOTE: This sheet is a summary. It may not cover all possible information. If you have questions about this medicine, talk to your doctor, pharmacist, or health care provider.  2021 Elsevier/Gold  Standard (2020-01-11 09:45:03)

## 2020-10-19 ENCOUNTER — Ambulatory Visit: Payer: BC Managed Care – PPO | Admitting: Internal Medicine

## 2020-10-23 ENCOUNTER — Telehealth: Payer: Self-pay

## 2020-10-23 ENCOUNTER — Ambulatory Visit: Payer: BC Managed Care – PPO | Admitting: Internal Medicine

## 2020-10-23 NOTE — Telephone Encounter (Signed)
Pt came in for appt but was 18 minutes late so we rescheduled her appt for the soonest which is in October. 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 She states that she has been looking forward to this appt for a while and we had rescheduled with her once before. She asks that Dr Darrick Huntsman send a referral for an allergist because she is having an allergic reaction on and off to something she is ingesting. She states that it is not detergent or shampoo. She is not sure why she would need an appt for a referral to be placed. Please advise at your earliest convenience.

## 2020-10-26 NOTE — Telephone Encounter (Signed)
Spoke with pt and scheduled her for a virtual visit. Pt is aware of appt date and time.

## 2020-11-01 ENCOUNTER — Ambulatory Visit: Payer: BC Managed Care – PPO | Admitting: Nurse Practitioner

## 2020-11-16 ENCOUNTER — Telehealth (INDEPENDENT_AMBULATORY_CARE_PROVIDER_SITE_OTHER): Payer: BC Managed Care – PPO | Admitting: Internal Medicine

## 2020-11-16 VITALS — Ht 67.0 in | Wt 134.0 lb

## 2020-11-16 DIAGNOSIS — L509 Urticaria, unspecified: Secondary | ICD-10-CM

## 2020-11-16 NOTE — Progress Notes (Signed)
Virtual Visit via Caregility  Note  This visit type was conducted due to national recommendations for restrictions regarding the COVID-19 pandemic (e.g. social distancing).  This format is felt to be most appropriate for this patient at this time.  All issues noted in this document were discussed and addressed.  No physical exam was performed (except for noted visual exam findings with Video Visits).   I connected withNAME@ on 11/16/20 at 11:00 AM EDT by a video enabled telemedicine application  and verified that I am speaking with the correct person using two identifiers. Location patient: home Location provider: work or home office Persons participating in the virtual visit: patient, provider  I discussed the limitations, risks, security and privacy concerns of performing an evaluation and management service by telephone and the availability of in person appointments. I also discussed with the patient that there may be a patient responsible charge related to this service. The patient expressed understanding and agreed to proceed.  Reason for visit: recurrent rash   HPI:  History of environmental allergies historically treated with allergy shots until 2011.   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   Has developed a recurrent itchy rash , initially just lasting one day, and limited to lower back, however  last 2 episodes lasted 3 to 4 days   UTI occurred after 3rd episode of rash in last month . Treated on sept 1  for  E Coli  pyelonephritis  with Septra . On Sept 6 , symptoms had not improved went to [facility] ER with flank pain  and fevers.  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  Past Medical History:  Diagnosis Date   Allergy    seasonal, pollen, molds, some food.   Anxiety    Cyst, ovarian    Depression    prozac, wellbutrin    Past Surgical History:  Procedure Laterality Date   HERNIA REPAIR     LAPAROSCOPIC OVARIAN CYSTECTOMY Right 01/22/2016   Procedure: LAPAROSCOPIC RIGHT OVARIAN CYSTECTOMY;  Surgeon: [name], MD;  Location: [facility];  Service: Gynecology;  Laterality: Right;   NASAL SINUS SURGERY  01/2010   TONSILLECTOMY  1993   WISDOM TOOTH EXTRACTION  2000   x 2    Family History  Problem Relation Age of Onset   Cancer Mother        melanoma   Hyperlipidemia Mother    Depression Mother    Hyperlipidemia Father    Diabetes Father    Hypertension Father    Diabetes Maternal Uncle    Stroke Maternal Grandfather    Heart disease Maternal Grandfather    Diabetes Maternal Grandfather    Stroke Paternal Grandfather    Heart disease Paternal Grandfather    Cancer Maternal Grandmother        throat   Breast cancer Neg Hx     SOCIAL HX:  reports that she quit smoking about 15 years ago. Her smoking use included cigarettes. She has never used smokeless tobacco. She reports current alcohol use. She reports that she does not use drugs.    Current  Outpatient Medications:    albuterol (VENTOLIN HFA) 108 (90 Base) MCG/ACT inhaler, Inhale 2 puffs into the lungs every 6 (six) hours as needed for wheezing or shortness of breath., Disp: 1 g, Rfl: 10   citalopram (CELEXA) 10 MG tablet, Take 10 mg by mouth daily., Disp: , Rfl:    citalopram (CELEXA) 20 MG tablet, Take 20 mg by mouth daily., Disp: , Rfl:    estradiol (VIVELLE-DOT) 0.1 MG/24HR patch, Place 1 patch onto the skin 2 (two) times a week., Disp: , Rfl:    progesterone (PROMETRIUM) 100 MG capsule, Take 1 capsule (100 mg total) by mouth at bedtime. (Patient taking differently: Take 100 mg by mouth at bedtime. Takes 1 capsule on Monday, Wednesday, and Friday.), Disp: 30  capsule, Rfl: 11   traZODone (DESYREL) 50 MG tablet, Take 50 mg by mouth at bedtime. Takes 1/2 tablet(25 mg) by mouth at bedtime., Disp: , Rfl:    VYVANSE 20 MG capsule, Take 20 mg by mouth every morning., Disp: , Rfl:   EXAM:  VITALS per patient if applicable:  GENERAL: alert, oriented, appears well and in no acute distress  HEENT: atraumatic, conjunttiva clear, no obvious abnormalities on inspection of external nose and ears  NECK: normal movements of the head and neck  LUNGS: on inspection no signs of respiratory distress, breathing rate appears normal, no obvious gross SOB, gasping or wheezing  CV: no obvious cyanosis  MS: moves all visible extremities without noticeable abnormality  PSYCH/NEURO: pleasant and cooperative, no obvious depression or anxiety, speech and thought processing grossly intact  ASSESSMENT AND PLAN:  Discussed the following assessment and plan:  Urticarial rash - Plan: Ambulatory referral to Allergy  Urticarial rash Likely related to environmental triggers .  Recommending  Use of antihistamine and famotidine every 12 hours,  Referring to [facility] for testing     I discussed the assessment and treatment plan with the patient. The patient was provided an opportunity to ask questions and all were answered. The patient agreed with the plan and demonstrated an understanding of the instructions.   The patient was advised to call back or seek an in-person evaluation if the symptoms worsen or if the condition fails to improve as anticipated.   I spent 20 minutes dedicated to the care of this patient on the date of this encounter to include pre-visit review of his medical history,  Face-to-face time with the patient , and post visit ordering of testing and therapeutics.    Sherlene Shams, MD

## 2020-11-16 NOTE — Patient Instructions (Addendum)
I recommend starting a daily regimen of :  Famotidine 20 mg every 12 hours and cetirizine every 12 hours   Consider a functional study of your kidneys (done by urology)  Consider a trial of Flomax for prevention of ureteral pain   Allergy testing referral in progress to [place]

## 2020-11-18 NOTE — Assessment & Plan Note (Signed)
Likely related to environmental triggers .  Recommending  Use of antihistamine and famotidine every 12 hours,  Referring to [facility] for testing

## 2020-12-07 ENCOUNTER — Ambulatory Visit: Payer: BC Managed Care – PPO | Admitting: Internal Medicine

## 2021-07-17 ENCOUNTER — Other Ambulatory Visit: Payer: Self-pay | Admitting: Internal Medicine

## 2021-07-17 DIAGNOSIS — Z1231 Encounter for screening mammogram for malignant neoplasm of breast: Secondary | ICD-10-CM

## 2021-09-04 ENCOUNTER — Ambulatory Visit
Admission: RE | Admit: 2021-09-04 | Discharge: 2021-09-04 | Disposition: A | Discharge status: Home / Self Care | Payer: BC Managed Care – PPO | Source: Ambulatory Visit | Attending: Internal Medicine | Admitting: Internal Medicine

## 2021-09-04 DIAGNOSIS — Z1231 Encounter for screening mammogram for malignant neoplasm of breast: Secondary | ICD-10-CM | POA: Insufficient documentation

## 2021-12-23 IMAGING — MR MR BRAIN/IAC WO/W CM
10 of 14 series · 26 of 48 positions shown · IV contrast (gadavist)
Comparison: No pertinent prior exams are available for comparison.

CLINICAL DATA: Left asymmetrical sensorineural hearing loss.
Additional history provided by scanning technologist: Patient
reports left greater than right gradual hearing loss over the past
1-2 years.

EXAM:
MRI HEAD WITHOUT AND WITH CONTRAST
TECHNIQUE: Multiplanar, multiecho pulse sequences of the brain and surrounding
structures were obtained without and with intravenous contrast.
CONTRAST:  7mL GADAVIST GADOBUTROL 1 MMOL/ML IV SOLN

[Series 5: T1 · sagittal · 5.0mm · 0.62mm/px · 3 of 25 slices shown (1 of 3)]
[im 1/25]
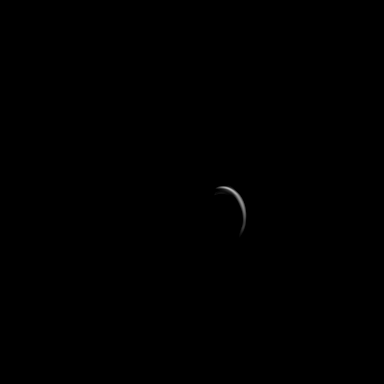
[im 13/25]
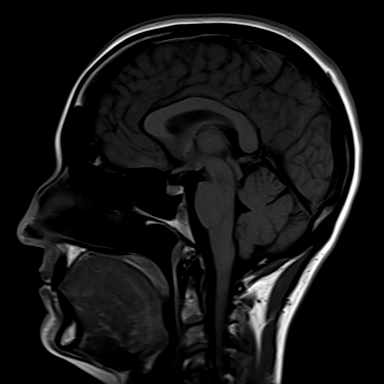
[im 25/25]
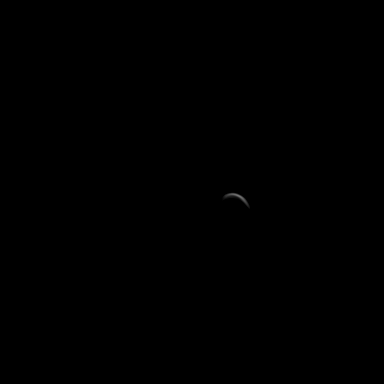

[Series 6: T2 · axial · 5.0mm · 0.53mm/px · z∈[-129,+26]mm · 2 of 27 slices shown]
[im 1/27]
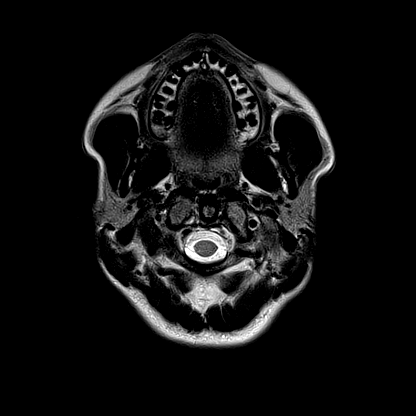
[im 27/27]
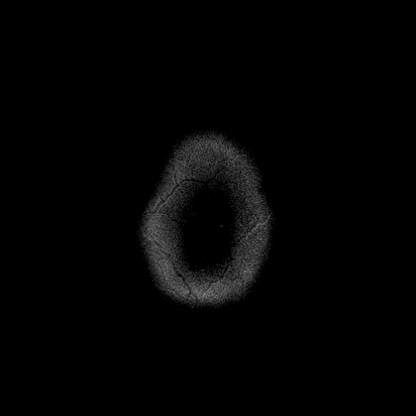

[Series 7: ax dwi_tracew · axial · 3.0mm · 0.60mm/px · z∈[-129,+25]mm · 3 of 48 slices shown]
[im 1/48]
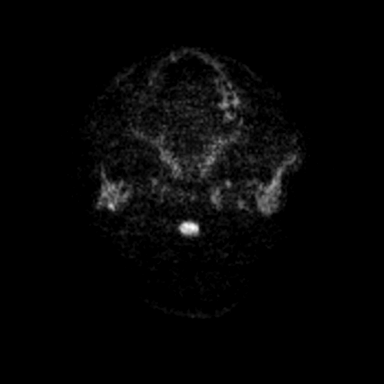
[im 24/48]
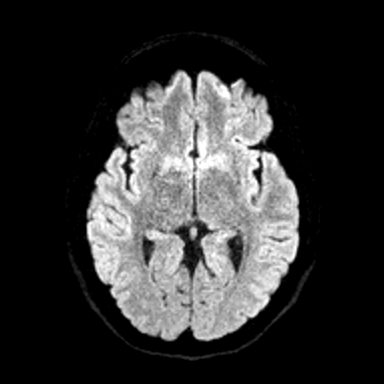
[im 48/48]
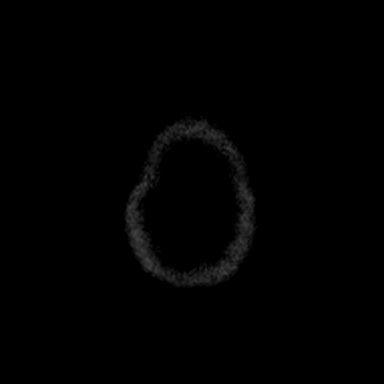

[Series 8: ax dwi_adc · axial · 3.0mm · 0.60mm/px · 1 of 48 slices shown]
[im 1/48]
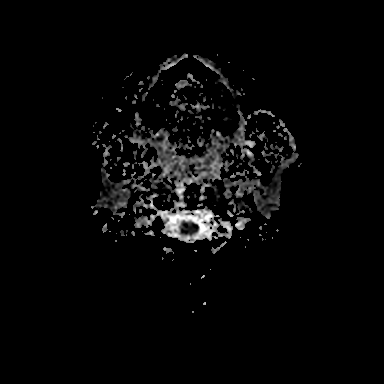

[Series 9: T1 · coronal · non-contrast · 3.0mm · 0.21mm/px · 1 of 13 slices shown (2 of 3)]
[im 1/13]
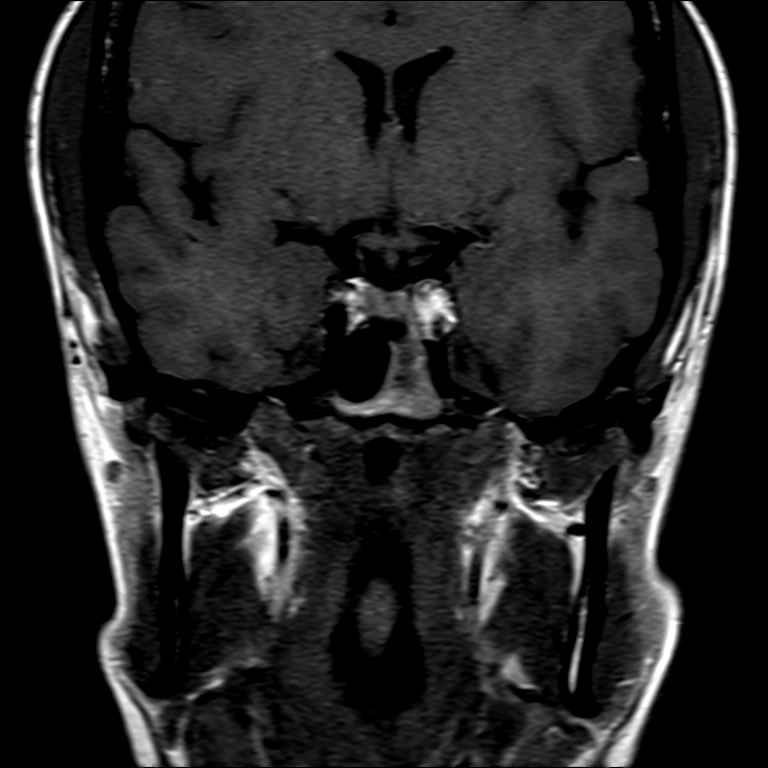

[Series 16: T1 · axial · non-contrast · 3.0mm · 0.21mm/px · 1 of 15 slices shown (3 of 3)]
[im 1/15]
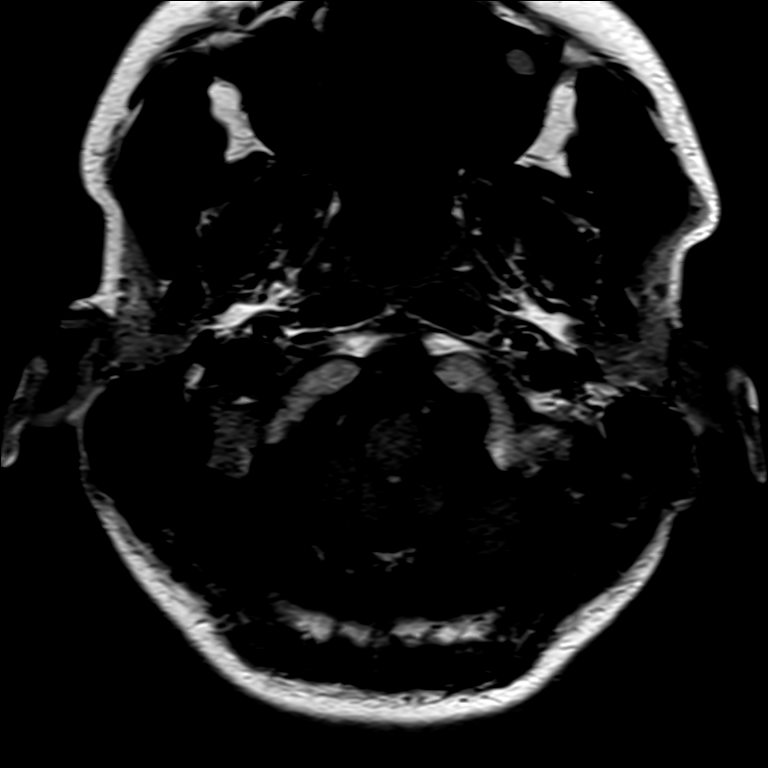

[Series 17: FLAIR · axial · 3.0mm · 0.53mm/px · z∈[-132,+29]mm · 4 of 55 slices shown]
[im 1/55]
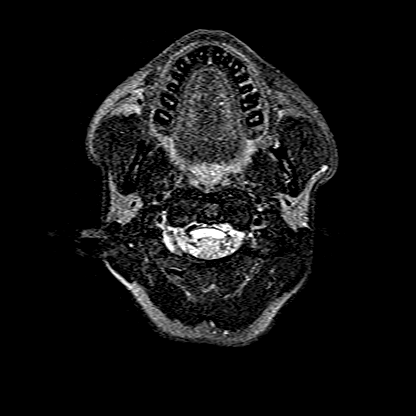
[im 19/55]
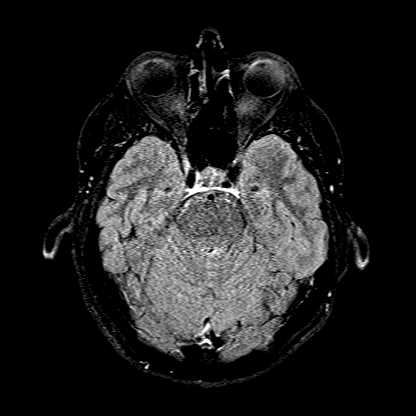
[im 37/55]
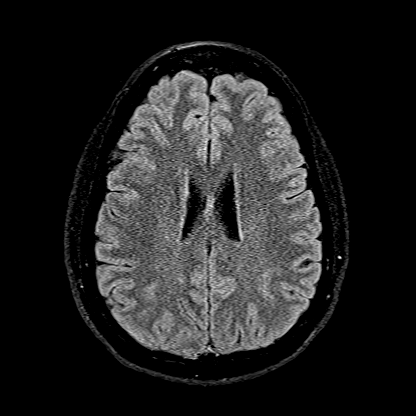
[im 55/55]
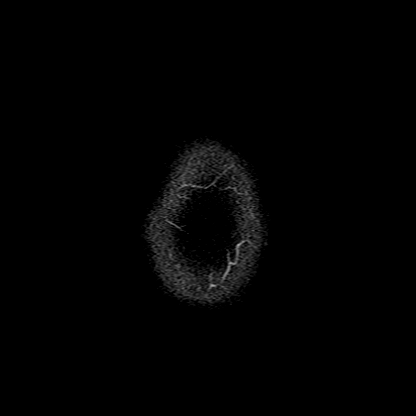

[Series 18: T1 post-contrast · axial · 3.0mm · 0.21mm/px · 1 of 15 slices shown (1 of 3)]
[im 1/15]
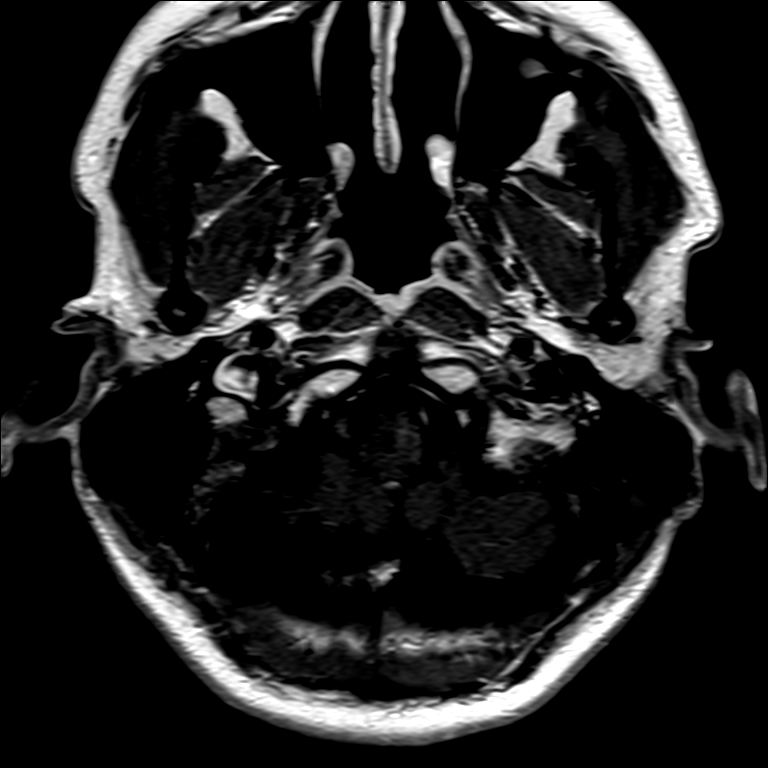

[Series 19: T1 post-contrast · coronal · 3.0mm · 0.21mm/px · 1 of 13 slices shown (2 of 3)]
[im 1/13]
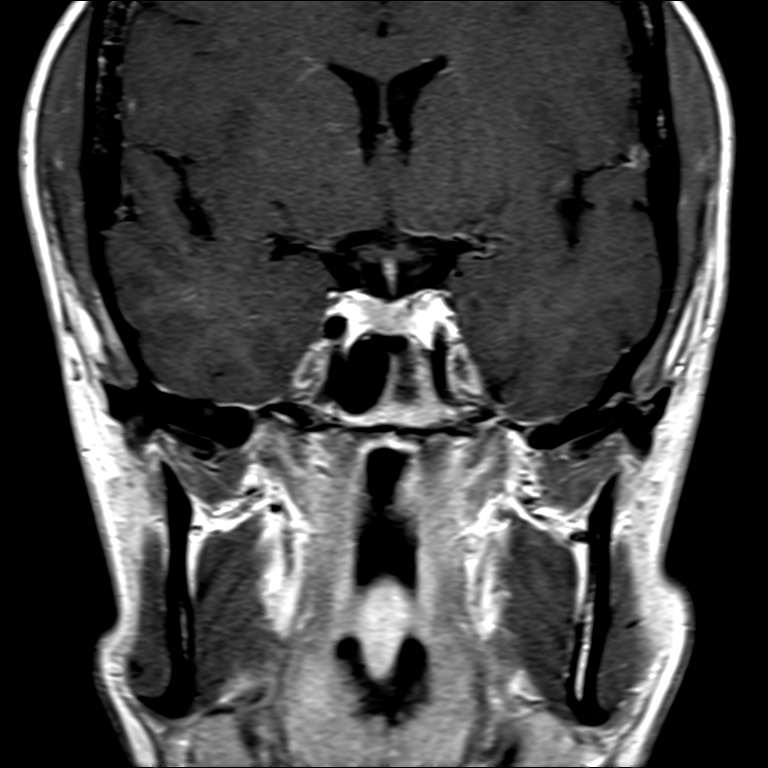

[Series 20: T1 post-contrast · axial · 1.0mm · 0.98mm/px · z∈[-140,+34]mm · 9 of 176 slices shown (3 of 3)]
[im 1/176]
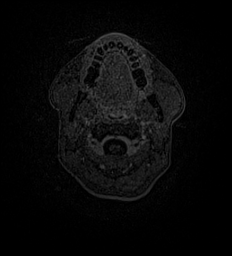
[im 30/176]
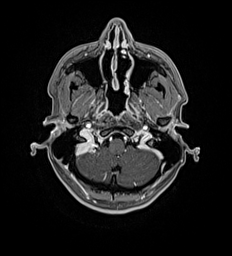
[im 59/176]
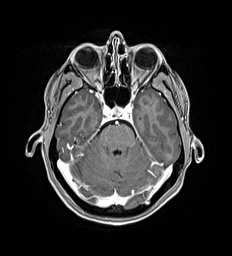
[im 73/176]
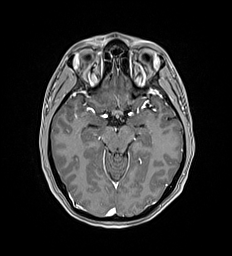
[im 88/176]
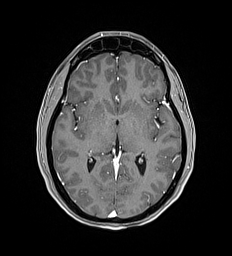
[im 103/176]
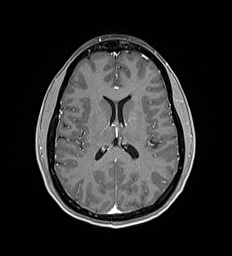
[im 117/176]
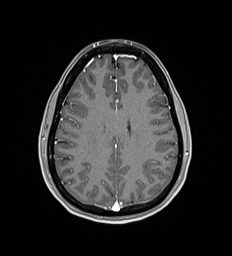
[im 146/176]
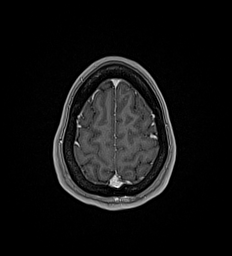
[im 176/176]
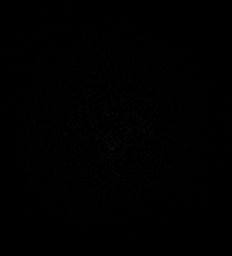

[26 of 48 positions shown; findings below may reference images not displayed]

FINDINGS: Brain:

Cerebral volume is normal.

No focal parenchymal signal abnormality is identified.

No intracranial mass is identified. Specifically, no
cerebellopontine angle mass or internal auditory canal lesion is
demonstrated.

No abnormal intracranial enhancement.

There is no acute infarct.

No chronic intracranial blood products.

No extra-axial fluid collection.

No midline shift.

Vascular: Expected proximal arterial flow voids.

Skull and upper cervical spine: No focal marrow lesion.

Sinuses/Orbits: Visualized orbits show no acute finding. Small right
maxillary sinus mucous retention cyst. Minimal ethmoid sinus mucosal
thickening. No significant mastoid effusion
IMPRESSION: Unremarkable MRI appearance of the brain.

No cerebellopontine angle mass or internal auditory canal lesion is
demonstrated.

No evidence of acute intracranial abnormality.

Mild ethmoid sinus mucosal thickening.

Small right maxillary sinus mucous retention cyst.

## 2022-01-27 ENCOUNTER — Encounter: Payer: Self-pay | Admitting: Internal Medicine

## 2022-01-27 ENCOUNTER — Ambulatory Visit: Payer: BC Managed Care – PPO | Admitting: Internal Medicine

## 2022-01-27 VITALS — BP 112/60 | HR 90 | Temp 99.3°F | Ht 67.0 in | Wt 147.0 lb

## 2022-01-27 DIAGNOSIS — J4520 Mild intermittent asthma, uncomplicated: Secondary | ICD-10-CM

## 2022-01-27 DIAGNOSIS — J069 Acute upper respiratory infection, unspecified: Secondary | ICD-10-CM | POA: Insufficient documentation

## 2022-01-27 DIAGNOSIS — J309 Allergic rhinitis, unspecified: Secondary | ICD-10-CM

## 2022-01-27 LAB — POC INFLUENZA A&B (BINAX/QUICKVUE)
Influenza A, POC: NEGATIVE
Influenza B, POC: NEGATIVE

## 2022-01-27 LAB — POC SOFIA SARS ANTIGEN FIA: SARS Coronavirus 2 Ag: NEGATIVE

## 2022-01-27 MED ORDER — HYDROCODONE BIT-HOMATROP MBR 5-1.5 MG/5ML PO SOLN: 5.0000 mL | Freq: Four times a day (QID) | ORAL | 0 refills | Status: AC | PRN

## 2022-01-27 MED ORDER — AMOXICILLIN-POT CLAVULANATE 875-125 MG PO TABS: 1.0000 | ORAL_TABLET | Freq: Two times a day (BID) | ORAL | 0 refills | Status: DC

## 2022-01-27 NOTE — Assessment & Plan Note (Signed)
Stable without worsening - to continue albuterol hfa prn

## 2022-01-27 NOTE — Patient Instructions (Signed)
Your COVID and Flu testing was done today  Please take all new medication as prescribed - the antibiotic, and cough medicine as needed  You can also take Mucinex (or it's generic off brand) for congestion, and tylenol as needed for pain.  Wynelle Link continue all other medications as before, and refills have been done if requested.  Please have the pharmacy call with any other refills you may need.  Please keep your appointments with your specialists as you may have planned

## 2022-01-27 NOTE — Assessment & Plan Note (Signed)
Covid and flu neg - Mild to mod, for antibx course, cough med prn, to f/u any worsening symptoms or concerns

## 2022-01-27 NOTE — Assessment & Plan Note (Signed)
Stable, continue otc allegra or zyrtec prn,  to f/u any worsening symptoms or concerns

## 2022-01-27 NOTE — Progress Notes (Signed)
Patient ID: Bianca Walton, female   DOB: [birth year], 51 y.o.   MRN: [id number]        Chief Complaint: follow up sinus pain and fever, allergies, asthma       HPI:  Bianca Walton is a 51 y.o. female  Here with 4-5 days acute onset fever, facial pain, pressure, headache, general weakness and malaise, and greenish d/c, with mild ST and cough, but pt denies chest pain, wheezing, increased sob or doe, orthopnea, PND, increased LE swelling, palpitations, dizziness or syncope.  Has hx of chronic infections and sinus surgury in the past.  Cefdinir has not helped, but hoping for a different antibiotic.  Has hx of allergies and did rake leaves the morning of onset symptoms but this is different.  Tested herself covid negative the morning after onset symptoms  Wt Readings from Last 3 Encounters:  01/27/22 147 lb (66.7 kg)  11/16/20 134 lb (60.8 kg)  08/09/20 132 lb (59.9 kg)   BP Readings from Last 3 Encounters:  01/27/22 112/60  07/27/20 110/75  04/20/20 108/64         Past Medical History:  Diagnosis Date   Allergy    seasonal, pollen, molds, some food.   Anxiety    Cyst, ovarian    Depression    prozac, wellbutrin   Past Surgical History:  Procedure Laterality Date   HERNIA REPAIR     LAPAROSCOPIC OVARIAN CYSTECTOMY Right 01/22/2016   Procedure: LAPAROSCOPIC RIGHT OVARIAN CYSTECTOMY;  Surgeon: [name], MD;  Location: [facility];  Service: Gynecology;  Laterality: Right;   NASAL SINUS SURGERY  01/2010   TONSILLECTOMY  1993   WISDOM TOOTH EXTRACTION  2000   x 2    reports that she quit smoking about 17 years ago. Her smoking use included cigarettes. She has never used smokeless tobacco. She reports current alcohol use. She reports that she does not use drugs. family history includes Cancer in her maternal grandmother and mother; Depression in her mother; Diabetes in her father, maternal grandfather, and maternal uncle; Heart disease in her maternal grandfather and paternal  grandfather; Hyperlipidemia in her father and mother; Hypertension in her father; Stroke in her maternal grandfather and paternal grandfather. No Known Allergies Current Outpatient Medications on File Prior to Visit  Medication Sig Dispense Refill   albuterol (VENTOLIN HFA) 108 (90 Base) MCG/ACT inhaler Inhale 2 puffs into the lungs every 6 (six) hours as needed for wheezing or shortness of breath. 1 g 10   citalopram (CELEXA) 10 MG tablet Take 10 mg by mouth daily.     citalopram (CELEXA) 20 MG tablet Take 20 mg by mouth daily.     estradiol (VIVELLE-DOT) 0.1 MG/24HR patch Place 1 patch onto the skin 2 (two) times a week.     progesterone (PROMETRIUM) 100 MG capsule Take 1 capsule (100 mg total) by mouth at bedtime. (Patient taking differently: Take 100 mg by mouth at bedtime. Takes 1 capsule on Monday, Wednesday, and Friday.) 30 capsule 11   traZODone (DESYREL) 50 MG tablet Take 50 mg by mouth at bedtime. Takes 1/2 tablet(25 mg) by mouth at bedtime.     VYVANSE 20 MG capsule Take 20 mg by mouth every morning.     [DISCONTINUED] mometasone-formoterol (DULERA) 100-5 MCG/ACT AERO Inhale 2 puffs into the lungs 2 (two) times daily. 1 g 10   No current facility-administered medications on file prior to visit.        ROS:  All others reviewed  and negative.  Objective        PE:  BP 112/60 (BP Location: Right Arm, Patient Position: Sitting, Cuff Size: Large)   Pulse 90   Temp 99.3 F (37.4 C) (Oral)   Ht 5\' 7"  (1.702 m)   Wt 147 lb (66.7 kg)   SpO2 96%   BMI 23.02 kg/m                 Constitutional: Pt appears in NAD, mild ill               HENT: Head: NCAT.                Right Ear: External ear normal.                 Left Ear: External ear normal. Bilat tm's with mild erythema.  Max sinus areas mild tender.  Pharynx with mild erythema, no exudate               Eyes: . Pupils are equal, round, and reactive to light. Conjunctivae and EOM are normal               Nose: without d/c or  deformity               Neck: Neck supple. Gross normal ROM               Cardiovascular: Normal rate and regular rhythm.                 Pulmonary/Chest: Effort normal and breath sounds without rales or wheezing.                              Neurological: Pt is alert. At baseline orientation, motor grossly intact               Skin: Skin is warm. No rashes, no other new lesions, LE edema - none               Psychiatric: Pt behavior is normal without agitation , mild nervous  Micro: none  Cardiac tracings I have personally interpreted today:  none  Pertinent Radiological findings (summarize): none   Lab Results  Component Value Date   WBC 6.4 04/20/2020   HGB 13.5 04/20/2020   HCT 39.1 04/20/2020   PLT 248 04/20/2020   GLUCOSE 84 04/20/2020   CHOL 216 (H) 04/20/2020   TRIG 65 04/20/2020   HDL 87 04/20/2020   LDLCALC 114 (H) 04/20/2020   ALT 17 04/20/2020   AST 16 04/20/2020   NA 138 04/20/2020   K 3.9 04/20/2020   CL 102 04/20/2020   CREATININE 0.83 04/20/2020   BUN 19 04/20/2020   CO2 26 04/20/2020   TSH 2.47 04/20/2020   POCT - COVID and Flu A/B - negative  Assessment/Plan:  Bianca Walton is a 51 y.o. White or Caucasian [1] female with  has a past medical history of Allergy, Anxiety, Cyst, ovarian, and Depression.  Acute upper respiratory infection Covid and flu neg - Mild to mod, for antibx course, cough med prn, to f/u any worsening symptoms or concerns  Allergic rhinitis Stable, continue otc allegra or zyrtec prn,  to f/u any worsening symptoms or concerns  Asthma in adult Stable without worsening - to continue albuterol hfa prn  Followup: Return if symptoms worsen or fail to improve.  55, MD 01/27/2022 9:53 AM [facility]

## 2022-04-14 ENCOUNTER — Telehealth (INDEPENDENT_AMBULATORY_CARE_PROVIDER_SITE_OTHER): Payer: BC Managed Care – PPO | Admitting: Family Medicine

## 2022-04-14 ENCOUNTER — Ambulatory Visit: Payer: BC Managed Care – PPO | Admitting: Family Medicine

## 2022-04-14 ENCOUNTER — Encounter: Payer: Self-pay | Admitting: Family Medicine

## 2022-04-14 DIAGNOSIS — J0141 Acute recurrent pansinusitis: Secondary | ICD-10-CM

## 2022-04-14 MED ORDER — SACCHAROMYCES BOULARDII 250 MG PO CAPS: 250.0000 mg | ORAL_CAPSULE | Freq: Every day | ORAL | 0 refills | Status: DC

## 2022-04-14 MED ORDER — BENZONATATE 100 MG PO CAPS: 200.0000 mg | ORAL_CAPSULE | Freq: Three times a day (TID) | ORAL | 0 refills | Status: DC | PRN

## 2022-04-14 MED ORDER — AMOXICILLIN-POT CLAVULANATE 875-125 MG PO TABS: 1.0000 | ORAL_TABLET | Freq: Two times a day (BID) | ORAL | 0 refills | Status: AC

## 2022-04-14 MED ORDER — BUDESONIDE 0.5 MG/2ML IN SUSP: 0.5000 mg | Freq: Every day | RESPIRATORY_TRACT | 0 refills | Status: DC

## 2022-04-14 NOTE — Patient Instructions (Addendum)
It was a pleasure meeting you today. Thank you for allowing me to take part in your health care.  Our goals for today as we discussed include:  Start Augmentin two times a day for 5 days Start Probiotics daily and continue for 2 weeks after completion of antibiotics Tessalon pearls for cough Budesonide nasal rinse daily  Follow up with ENT as scheduled    If you have any questions or concerns, please do not hesitate to call the office at [phone].  I look forward to our next visit and until then take care and stay safe.  Regards,   Carollee Leitz, MD   [facility]

## 2022-04-14 NOTE — Progress Notes (Unsigned)
Virtual Visit via Video note  I connected with Bianca Walton on 04/15/22 at 1616 by video and verified that I am speaking with the correct person using two identifiers. Bianca Walton is currently located at work and is currently alone during visit. The provider, Carollee Leitz, MD is located in their office at time of visit.  I discussed the limitations, risks, security and privacy concerns of performing an evaluation and management service by video and the availability of in person appointments. I also discussed with the patient that there may be a patient responsible charge related to this service. The patient expressed understanding and agreed to proceed.  Subjective: PCP: Crecencio Mc, MD  Chief Complaint  Patient presents with   Sinusitis    HPI Patient reports cough x 2 weeks.  Started February 2.  Endorses sinus congestion, pressure and sinus headache.  Reports low-grade fevers 99-100.  Associated with cough producing yellow-green phlegm.  Cough worse at night.  Endorses mild wheezing and shortness of breath with increased activity.  Denies any chest pain, shortness of breath at rest.  Reports having history of recurrent sinus infections and sinus surgery.  Followed by ENT, Dr. Tami Ribas and reports appointment in near future.  Endorses last sinus infection November, treated with antibiotics.  Has also noted that budesonide neb nasal rinse helps but has run out.  ROS: Per HPI  Current Outpatient Medications:    amoxicillin-clavulanate (AUGMENTIN) 875-125 MG tablet, Take 1 tablet by mouth 2 (two) times daily for 5 days., Disp: 10 tablet, Rfl: 0   benzonatate (TESSALON PERLES) 100 MG capsule, Take 2 capsules (200 mg total) by mouth 3 (three) times daily as needed for cough., Disp: 20 capsule, Rfl: 0   budesonide (PULMICORT) 0.5 MG/2ML nebulizer solution, Take 2 mLs (0.5 mg total) by nebulization daily., Disp: 30 mL, Rfl: 0   saccharomyces boulardii (FLORASTOR) 250 MG capsule, Take 1  capsule (250 mg total) by mouth daily., Disp: 90 capsule, Rfl: 0   albuterol (VENTOLIN HFA) 108 (90 Base) MCG/ACT inhaler, Inhale 2 puffs into the lungs every 6 (six) hours as needed for wheezing or shortness of breath., Disp: 1 g, Rfl: 10   citalopram (CELEXA) 10 MG tablet, Take 10 mg by mouth daily., Disp: , Rfl:    citalopram (CELEXA) 20 MG tablet, Take 20 mg by mouth daily., Disp: , Rfl:    estradiol (VIVELLE-DOT) 0.1 MG/24HR patch, Place 1 patch onto the skin 2 (two) times a week., Disp: , Rfl:    progesterone (PROMETRIUM) 100 MG capsule, Take 1 capsule (100 mg total) by mouth at bedtime. (Patient taking differently: Take 100 mg by mouth at bedtime. Takes 1 capsule on Monday, Wednesday, and Friday.), Disp: 30 capsule, Rfl: 11   traZODone (DESYREL) 50 MG tablet, Take 50 mg by mouth at bedtime. Takes 1/2 tablet(25 mg) by mouth at bedtime., Disp: , Rfl:    VYVANSE 20 MG capsule, Take 20 mg by mouth every morning., Disp: , Rfl:   Observations/Objective: Physical Exam HENT:     Nose: Congestion present.  Pulmonary:     Effort: Pulmonary effort is normal.  Neurological:     Mental Status: She is alert and oriented to person, place, and time. Mental status is at baseline.  Psychiatric:        Mood and Affect: Mood normal.        Behavior: Behavior normal.        Thought Content: Thought content normal.  Judgment: Judgment normal.    Assessment and Plan: Acute recurrent pansinusitis Assessment & Plan: Acute on chronic.  Symptoms for 2 weeks Treat with Augmentin x 5 days Start Probiotics daily and continue for 2 weeks after completion of treatment Budesonide 0.62m nasal rinse daily Tessalon Pearls for cough at night Follow up with ENT as scheduled If no improvement in symptoms f/u with PCP  Orders: -     Amoxicillin-Pot Clavulanate; Take 1 tablet by mouth 2 (two) times daily for 5 days.  Dispense: 10 tablet; Refill: 0 -     Benzonatate; Take 2 capsules (200 mg total) by mouth 3  (three) times daily as needed for cough.  Dispense: 20 capsule; Refill: 0 -     Budesonide; Take 2 mLs (0.5 mg total) by nebulization daily.  Dispense: 30 mL; Refill: 0 -     Saccharomyces boulardii; Take 1 capsule (250 mg total) by mouth daily.  Dispense: 90 capsule; Refill: 0    Follow Up Instructions: Return if symptoms worsen or fail to improve.   I discussed the assessment and treatment plan with the patient. The patient was provided an opportunity to ask questions and all were answered. The patient agreed with the plan and demonstrated an understanding of the instructions.   The patient was advised to call back or seek an in-person evaluation if the symptoms worsen or if the condition fails to improve as anticipated.  The above assessment and management plan was discussed with the patient. The patient verbalized understanding of and has agreed to the management plan. Patient is aware to call the clinic if symptoms persist or worsen. Patient is aware when to return to the clinic for a follow-up visit. Patient educated on when it is appropriate to go to the emergency department.   PDMP Reviewed  TCarollee Leitz MD

## 2022-04-14 NOTE — Assessment & Plan Note (Signed)
Symptoms for 2 weeks Treat with Augmentin x 5 days Start Probiotics daily and continue for 2 weeks after completion of treatment Budesonide 0.66m nasal rinse daily Tessalon Pearls for cough at night Follow up with ENT as scheduled If no improvement in symptoms f/u with PCP

## 2022-05-07 ENCOUNTER — Other Ambulatory Visit: Payer: Self-pay

## 2022-05-07 DIAGNOSIS — J0141 Acute recurrent pansinusitis: Secondary | ICD-10-CM

## 2022-05-07 NOTE — Telephone Encounter (Signed)
I called and spoke with the patient and she stated she has a appointment scheduled with ENT on 05/15/2022 and she did not need the refill after all.  Jeziel Hoffmann,cma

## 2022-05-15 ENCOUNTER — Other Ambulatory Visit: Payer: Self-pay

## 2022-05-15 ENCOUNTER — Telehealth: Payer: Self-pay

## 2022-05-15 DIAGNOSIS — J0141 Acute recurrent pansinusitis: Secondary | ICD-10-CM

## 2022-05-15 NOTE — Telephone Encounter (Signed)
It was the Pulmicort nebulizer solution.  Patrece Tallie,cma

## 2022-05-16 MED ORDER — BUDESONIDE 0.5 MG/2ML IN SUSP: 0.5000 mg | Freq: Every day | RESPIRATORY_TRACT | 0 refills | Status: DC

## 2022-06-07 ENCOUNTER — Other Ambulatory Visit: Payer: Self-pay | Admitting: Internal Medicine

## 2022-06-07 DIAGNOSIS — J0141 Acute recurrent pansinusitis: Secondary | ICD-10-CM

## 2023-07-14 LAB — HM MAMMOGRAPHY

## 2023-07-22 ENCOUNTER — Ambulatory Visit (INDEPENDENT_AMBULATORY_CARE_PROVIDER_SITE_OTHER): Admitting: Family Medicine

## 2023-07-22 ENCOUNTER — Encounter: Payer: Self-pay | Admitting: Family Medicine

## 2023-07-22 VITALS — BP 98/60 | HR 83 | Temp 98.8°F | Resp 20 | Ht 67.0 in | Wt 148.1 lb

## 2023-07-22 DIAGNOSIS — J329 Chronic sinusitis, unspecified: Secondary | ICD-10-CM

## 2023-07-22 DIAGNOSIS — J040 Acute laryngitis: Secondary | ICD-10-CM

## 2023-07-22 MED ORDER — PREDNISONE 20 MG PO TABS: 20.0000 mg | ORAL_TABLET | Freq: Every day | ORAL | 0 refills | Status: AC

## 2023-07-22 MED ORDER — BENZONATATE 100 MG PO CAPS: 200.0000 mg | ORAL_CAPSULE | Freq: Three times a day (TID) | ORAL | 0 refills | Status: AC | PRN

## 2023-07-22 NOTE — Patient Instructions (Addendum)
 It was a pleasure meeting you today. Thank you for allowing me to take part in your health care.  Our goals for today as we discussed include:  Start Prednisone  20 mg daily Tessalon  perles three times a day for cough  Follow up with ENT tomorrow as scheduled  You can take Tylenol  and/or Ibuprofen as needed for fever reduction and pain relief.   For cough: honey 1/2 to 1 teaspoon (you can dilute the honey in water or another fluid).  You can also use guaifenesin and dextromethorphan for cough. You can use a humidifier for chest congestion and cough.  If you don't have a humidifier, you can sit in the bathroom with the hot shower running.      For sore throat: try warm salt water gargles, cepacol lozenges, throat spray, warm tea or water with lemon/honey, popsicles or ice, or OTC cold relief medicine for throat discomfort.   For congestion: take a daily anti-histamine like Zyrtec. Can switch to Xyzal, Allegra or Claritin. You can also use Flonase 1-2 sprays in each nostril two times a day.     It is important to stay hydrated: drink plenty of fluids (water, gatorade/powerade/pedialyte, juices, or teas) to keep your throat moisturized and help further relieve irritation/discomfort.     This is a list of the screening recommended for you and due dates:  Health Maintenance  Topic Date Due   HIV Screening  Never done   Hepatitis C Screening  Never done   DTaP/Tdap/Td vaccine (1 - Tdap) Never done   Pneumococcal Vaccination (1 of 2 - PCV) Never done   Colon Cancer Screening  Never done   Pap with HPV screening  08/06/2017   Zoster (Shingles) Vaccine (1 of 2) Never done   COVID-19 Vaccine (5 - 2024-25 season) 11/02/2022   Flu Shot  10/02/2023   Mammogram  07/13/2025   HPV Vaccine  Aged Out   Meningitis B Vaccine  Aged Out      If you have any questions or concerns, please do not hesitate to call the office at [phone].  I look forward to our next visit and until then take care  and stay safe.  Regards,   Valli Gaw, MD   [facility]

## 2023-07-22 NOTE — Progress Notes (Unsigned)
 SUBJECTIVE:   Chief Complaint  Patient presents with   Nasal Congestion    X couple of weeks has lost her voice    HPI Presents for acute visit  Discussed the use of AI scribe software for clinical note transcription with the patient, who gave verbal consent to proceed.  History of Present Illness Bianca Walton is a 53 year old female with chronic sinusitis who presents with congestion and voice loss.  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 The school environment is described as dusty, especially at this time of year, making it hard to avoid exposure. 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  A couple of days ago, she lost her voice and is now starting to regain it in 'little spurts.' This morning, she experienced productive coughing with dark green sputum, prompting her to seek an appointment. No fever, shortness of breath, or wheezing, but she mentions feeling 'heavy.' She has not checked herself for COVID-19 and denies any ear pressure or pain.  Her past medical history includes chronic sinusitis, for which she has had multiple surgeries. She was treated with Augmentin  a couple of months ago, but it was not effective. She has a history of intermittent asthma and sometimes uses an inhaler. She has tried Astelin spray in the past but dislikes it due to its taste. She uses budesonide  nasal treatments and takes Zyrtec every night since February to manage her allergies. She has previously used Allegra and another unspecified allergy medication.     PERTINENT PMH / PSH: As above  OBJECTIVE:  BP 98/60   Pulse 83   Temp 98.8 F (37.1 C)   Resp 20   Ht 5\' 7"  (1.702 m)   Wt 148 lb 2 oz (67.2 kg)   LMP 06/11/2023   SpO2 98%   BMI 23.20 kg/m    Physical Exam Vitals reviewed.  Constitutional:      General: She is not in acute distress.     Appearance: Normal appearance. She is not ill-appearing, toxic-appearing or diaphoretic.  HENT:     Right Ear: Tympanic membrane, ear canal and external ear normal. There is no impacted cerumen.     Left Ear: Tympanic membrane, ear canal and external ear normal. There is no impacted cerumen.     Nose: Nose normal. No congestion or rhinorrhea.     Mouth/Throat:     Mouth: Mucous membranes are moist.     Pharynx: Oropharynx is clear. Postnasal drip present. No oropharyngeal exudate or posterior oropharyngeal erythema.  Eyes:     General:        Right eye: No discharge.        Left eye: No discharge.     Conjunctiva/sclera: Conjunctivae normal.  Cardiovascular:     Rate and Rhythm: Normal rate and regular rhythm.     Heart sounds: Normal heart sounds.  Pulmonary:     Effort: Pulmonary effort is normal.     Breath sounds: Normal breath sounds.  Musculoskeletal:        General: Normal range of motion.  Lymphadenopathy:     Cervical: No cervical adenopathy.  Skin:    General: Skin is warm and dry.  Neurological:     General: No focal deficit present.     Mental Status: She is alert and oriented to person, place, and time. Mental status is  at baseline.  Psychiatric:        Mood and Affect: Mood normal.        Behavior: Behavior normal.        Thought Content: Thought content normal.        Judgment: Judgment normal.           07/22/2023    2:21 PM 11/16/2020   11:13 AM 08/09/2020   11:08 AM 04/20/2020    4:06 PM  Depression screen PHQ 2/9  Decreased Interest 0 0 0 1  Down, Depressed, Hopeless 0 0 0 1  PHQ - 2 Score 0 0 0 2  Altered sleeping 0   2  Tired, decreased energy 1   2  Change in appetite 0   2  Feeling bad or failure about yourself  0   1  Trouble concentrating 0   0  Moving slowly or fidgety/restless 0   0  Suicidal thoughts 0   0  PHQ-9 Score 1   9  Difficult doing work/chores Not difficult at all   Not difficult at all      07/22/2023    2:21 PM  GAD 7 :  Generalized Anxiety Score  Nervous, Anxious, on Edge 0  Control/stop worrying 0  Worry too much - different things 0  Trouble relaxing 0  Restless 0  Easily annoyed or irritable 0  Afraid - awful might happen 0  Total GAD 7 Score 0  Anxiety Difficulty Not difficult at all    ASSESSMENT/PLAN:  Laryngitis Assessment & Plan: Viral laryngitis. No bacterial infection; antibiotics not needed. Advised against whispering. Discussed corticosteroids for inflammation. - Prescribe corticosteroids for a few days. - Provide laryngitis management information, including hydration and voice rest. - Instruct to monitor for fever and report if it develops. -  Orders: -     predniSONE ; Take 1 tablet (20 mg total) by mouth daily with breakfast for 5 days.  Dispense: 5 tablet; Refill: 0  Chronic sinusitis, unspecified location Assessment & Plan: Chronic sinusitis with history of surgeries and ineffective Augmentin  treatment. No current sinus pressure or pain. Discussed Budesonide  use. - Increase Flonase to two sprays twice daily. - Has upcoming appointment with ENT specialist Dr. Silvestre Drum tomorrow   Other orders -     Benzonatate ; Take 2 capsules (200 mg total) by mouth 3 (three) times daily as needed for cough.  Dispense: 20 capsule; Refill: 0    PDMP reviewed  Return if symptoms worsen or fail to improve, for PCP.  Valli Gaw, MD

## 2023-07-27 ENCOUNTER — Encounter: Payer: Self-pay | Admitting: Family Medicine

## 2023-07-27 DIAGNOSIS — J329 Chronic sinusitis, unspecified: Secondary | ICD-10-CM | POA: Insufficient documentation

## 2023-07-27 DIAGNOSIS — J040 Acute laryngitis: Secondary | ICD-10-CM | POA: Insufficient documentation

## 2023-07-27 NOTE — Assessment & Plan Note (Signed)
 Viral laryngitis. No bacterial infection; antibiotics not needed. Advised against whispering. Discussed corticosteroids for inflammation. - Prescribe corticosteroids for a few days. - Provide laryngitis management information, including hydration and voice rest. - Instruct to monitor for fever and report if it develops. -

## 2023-07-27 NOTE — Assessment & Plan Note (Addendum)
 Chronic sinusitis with history of surgeries and ineffective Augmentin  treatment. No current sinus pressure or pain. Discussed Budesonide  use. - Increase Flonase to two sprays twice daily. - Has upcoming appointment with ENT specialist Dr. Silvestre Drum tomorrow

## 2023-07-28 ENCOUNTER — Ambulatory Visit
Admission: RE | Admit: 2023-07-28 | Discharge: 2023-07-28 | Disposition: A | Discharge status: Home / Self Care | Source: Ambulatory Visit | Attending: Unknown Physician Specialty | Admitting: Unknown Physician Specialty

## 2023-07-28 ENCOUNTER — Other Ambulatory Visit: Payer: Self-pay | Admitting: Unknown Physician Specialty

## 2023-07-28 DIAGNOSIS — R059 Cough, unspecified: Secondary | ICD-10-CM

## 2023-11-10 ENCOUNTER — Ambulatory Visit: Payer: Self-pay

## 2023-11-10 NOTE — Telephone Encounter (Signed)
 FYI Only or Action Required?: FYI only for provider.  Patient was last seen in primary care on 07/22/2023 by Hope Merle, MD.  Called Nurse Triage reporting No chief complaint on file..  Symptoms began a week ago.  Interventions attempted: Prescription medications: Cefidinir.  Symptoms are: unchanged.  Triage Disposition: See Physician Within 24 Hours  Patient/caregiver understands and will follow disposition?: Yes   Copied from CRM [id number]. Topic: Clinical - Red Word Triage >> Nov 10, 2023  3:49 PM Suzen RAMAN wrote: Red Word that prompted transfer to Nurse Triage: frequent urination, cloudy urine, slight abdominal pain Reason for Disposition  [1] Taking antibiotic > 72 hours (3 days) for UTI AND [2] painful urination or frequency is SAME (unchanged, not better)  Answer Assessment - Initial Assessment Questions 1. MAIN SYMPTOM: What is the main symptom you are concerned about? (e.g., painful urination, urine frequency)     Abdominal Pain, Urgency  2. BETTER-SAME-WORSE: Are you getting better, staying the same, or getting worse compared to how you felt at your last visit to the doctor (most recent medical visit)?     Same  3. PAIN: How bad is the pain?  (e.g., Scale 1-10; mild, moderate, or severe)     Mild to Moderate  4. FEVER: Do you have a fever? If Yes, ask: What is it, how was it measured, and when did it start?      No  5. OTHER SYMPTOMS: Do you have any other symptoms? (e.g., blood in the urine, flank pain, vaginal discharge)     Green Cloudy Urine  6. DIAGNOSIS: When was the UTI diagnosed? By whom? Was it a kidney infection, bladder infection or both?     7 Days ago  7. ANTIBIOTIC: What antibiotic(s) are you taking? How many times per day?     Cefdinir BID  8. ANTIBIOTIC - START DATE: When did you start taking the antibiotic?     7 Days ago  Protocols used: Urinary Tract Infection on Antibiotic Follow-up Call - [facility]

## 2023-11-10 NOTE — Telephone Encounter (Signed)
 Pt is scheduled to see Dr. Rollene tomorrow.

## 2023-11-11 ENCOUNTER — Encounter: Payer: Self-pay | Admitting: Internal Medicine

## 2023-11-11 ENCOUNTER — Ambulatory Visit: Admitting: Internal Medicine

## 2023-11-11 VITALS — BP 120/84 | HR 71 | Temp 98.1°F | Ht 67.0 in | Wt 134.0 lb

## 2023-11-11 DIAGNOSIS — B379 Candidiasis, unspecified: Secondary | ICD-10-CM

## 2023-11-11 DIAGNOSIS — Q625 Duplication of ureter: Secondary | ICD-10-CM

## 2023-11-11 DIAGNOSIS — N390 Urinary tract infection, site not specified: Secondary | ICD-10-CM | POA: Diagnosis not present

## 2023-11-11 DIAGNOSIS — R109 Unspecified abdominal pain: Secondary | ICD-10-CM

## 2023-11-11 LAB — POCT URINALYSIS DIPSTICK
Bilirubin, UA: NEGATIVE
Blood, UA: POSITIVE
Glucose, UA: NEGATIVE
Ketones, UA: NEGATIVE
Nitrite, UA: NEGATIVE
Protein, UA: NEGATIVE
Spec Grav, UA: 1.02 (ref 1.010–1.025)
Urobilinogen, UA: NEGATIVE U/dL — AB
pH, UA: 6 (ref 5.0–8.0)

## 2023-11-11 MED ORDER — SULFAMETHOXAZOLE-TRIMETHOPRIM 800-160 MG PO TABS: 1.0000 | ORAL_TABLET | Freq: Two times a day (BID) | ORAL | 0 refills | Status: AC

## 2023-11-11 NOTE — Patient Instructions (Signed)
 We will send in bactrim  to take 1 pill twice a day for 10 days. Call or mychart message us  back in 1-2 days if no improvement or sooner for worsening. If no improvement we may need to do a stomach scan to check for infection.

## 2023-11-11 NOTE — Assessment & Plan Note (Signed)
 Has just done diflucan and symptoms are improving. Monitor closely.

## 2023-11-11 NOTE — Assessment & Plan Note (Addendum)
 She has failed first antibiotic although culture is pan-sensitive. POC U/A done today with continued signs of infection. 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 Rx bactrim  10 day course to help clear infection and if no improvement in 1-2 days may need CT abdomen to rule out abscess.

## 2023-11-11 NOTE — Assessment & Plan Note (Signed)
 I suspect this contributed to her complicated UTI versus resolution with first antibiotic.

## 2023-11-11 NOTE — Progress Notes (Signed)
 Subjective:   Patient ID: Bianca Walton, female    DOB: 1970-05-11, 53 y.o.   MRN: [id number]  Discussed the use of AI scribe software for clinical note transcription with the patient, who gave verbal consent to proceed.  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  She has persistent symptoms of a urinary tract infection despite completing a course of antibiotics. She was diagnosed with a UTI approximately one week ago and completed the prescribed antibiotics this morning. However, she continues to experience abdominal tenderness, urinary urgency, and general malaise.  She has a history of flank and abdominal pain, which has now localized to tenderness across the abdomen without back pain. She also has persistent urinary urgency and a general feeling of unwellness. She has been febrile for three days during the course of her current illness.  After starting antibiotics, she developed symptoms consistent with a yeast infection, including severe soreness, burning, and itching in the genital area, described as 'razor blade' like pain. These symptoms began two days ago and have slightly improved since starting treatment, but she continues to experience burning, redness, and pain.  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 A CT scan from 2021 confirmed the presence of two ureters, and she has a history of seeing a urologist for this condition.  Review of Systems  Constitutional: Negative.   Respiratory: Negative.    Cardiovascular: Negative.   Gastrointestinal:  Positive for abdominal pain. Negative for abdominal distention, constipation, diarrhea, nausea and vomiting.  Genitourinary:  Positive for  dysuria, flank pain, frequency and urgency.  Skin: Negative.     Objective:  Physical Exam Constitutional:      Appearance: Normal appearance. She is well-developed.  HENT:     Head: Normocephalic and atraumatic.  Eyes:     Extraocular Movements: Extraocular movements intact.  Cardiovascular:     Rate and Rhythm: Normal rate and regular rhythm.  Pulmonary:     Effort: Pulmonary effort is normal. No respiratory distress.     Breath sounds: Normal breath sounds. No wheezing or rales.  Abdominal:     General: Bowel sounds are normal. There is no distension.     Palpations: Abdomen is soft.     Tenderness: There is abdominal tenderness. There is no rebound.  Musculoskeletal:     Cervical back: Normal range of motion.  Skin:    General: Skin is warm and dry.  Neurological:     Mental Status: She is alert and oriented to person, place, and time.     Coordination: Coordination normal.     Vitals:   11/11/23 0928  BP: 120/84  Pulse: 71  Temp: 98.1 F (36.7 C)  TempSrc: Oral  SpO2: 98%  Weight: 134 lb (60.8 kg)  Height: 5' 7 (1.702 m)    Assessment and Plan Assessment & Plan Complicated Urinary tract infection with persistent symptoms   Persistent UTI presents with flank and abdominal pain, urgency, and leukocytes in urine. E. coli identified, sensitive to initial antibiotic. 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 Prescribe a different antibiotic for UTI. Monitor symptoms for 1-2 days after starting the new antibiotic. Order imaging if no significant improvement in symptoms after 1-2  days.  Abdominal pain   Abdominal pain likely related to UTI. Tenderness across abdomen without back pain. Possible kidney involvement due to anatomical anomaly.  Vulvovaginal candidiasis   Likely secondary to antibiotic use. Symptoms include burning, itching, and soreness. Slight improvement with treatment but severe discomfort persists.  Duplication of left  ureter   Duplication may increase risk of kidney infections due to potential fluid backflow and scar tissue formation. Increased risk of persistent infection due to anatomical variation.

## 2023-11-27 ENCOUNTER — Ambulatory Visit: Payer: Self-pay

## 2023-11-27 NOTE — Telephone Encounter (Signed)
 Spoke with pt and she stated that she is going to UC.

## 2023-11-27 NOTE — Telephone Encounter (Signed)
 FYI Only or Action Required?: Action required by provider: request for appointment and clinical question for provider.  Patient was last seen in primary care on 11/11/2023 by Rollene Almarie LABOR, MD.  Called Nurse Triage reporting Urinary Frequency.  Symptoms began several days ago.  Interventions attempted: OTC medications: probiotics.  Symptoms are: unchanged.  Triage Disposition: See Physician Within 24 Hours  Patient/caregiver understands and will follow disposition?: Yes  Copied from CRM #[id number]. Topic: Clinical - Red Word Triage >> Nov 27, 2023 12:36 PM Rea ORN wrote: Red Word that prompted transfer to Nurse Triage: Pt believes UTI has returned. She is having abdomen pain, frequency and urgency to urinate and does not feel like she is fully empting bladder. Pt was has already had two rounds of abx this month Reason for Disposition  Urinating more frequently than usual (i.e., frequency) OR new-onset of the feeling of an urgent need to urinate (i.e., urgency)  Answer Assessment - Initial Assessment Questions No available appts today. Advised UC today and ED if symptoms worsen.  Patient reports completed antibiotics last Saturday, symptoms improved but did not go away.  1. SYMPTOM: What's the main symptom you're concerned about? (e.g., frequency, incontinence)     Frequency, urgency, cloudy urine, bladder pain, abdominal pain(3 abd surgery past year), denies painful urination, fever, chills, n/v 2. ONSET: When did the    start?     month 3. PAIN: Is there any pain? If Yes, ask: How bad is it? (Scale: 1-10; mild, moderate, severe)     4/10 4. CAUSE: What do you think is causing the symptoms?     Previously treated for UTI/kidney infection, did not go away, then seen Dr. Tullo, but it never went away 5. OTHER SYMPTOMS: Do you have any other symptoms? (e.g., blood in urine, fever, flank pain, pain with urination)     denies Taking prbiotics  Protocols used: Urinary  Symptoms-A-AH

## 2023-11-27 NOTE — Telephone Encounter (Signed)
 Called [facility], spoke with Darice, reports send message, no available appts.

## 2024-02-22 ENCOUNTER — Ambulatory Visit: Admitting: Family Medicine

## 2024-02-22 ENCOUNTER — Encounter: Payer: Self-pay | Admitting: Family Medicine

## 2024-02-22 VITALS — BP 89/59 | HR 89 | Ht 67.0 in | Wt 136.7 lb

## 2024-02-22 DIAGNOSIS — R051 Acute cough: Secondary | ICD-10-CM

## 2024-02-22 DIAGNOSIS — J4521 Mild intermittent asthma with (acute) exacerbation: Secondary | ICD-10-CM | POA: Diagnosis not present

## 2024-02-22 LAB — POC COVID19 BINAXNOW: SARS Coronavirus 2 Ag: NEGATIVE

## 2024-02-22 LAB — POCT INFLUENZA A/B
Influenza A, POC: NEGATIVE
Influenza B, POC: NEGATIVE

## 2024-02-22 MED ORDER — DOXYCYCLINE HYCLATE 100 MG PO TABS: 100.0000 mg | ORAL_TABLET | Freq: Two times a day (BID) | ORAL | 0 refills | Status: AC

## 2024-02-22 MED ORDER — DULERA 100-5 MCG/ACT IN AERO: 2.0000 | INHALATION_SPRAY | Freq: Two times a day (BID) | RESPIRATORY_TRACT | 1 refills | Status: AC

## 2024-02-22 NOTE — Progress Notes (Signed)
 "     Established patient visit   Patient: Bianca Walton   DOB: April 09, 1970   53 y.o. Female  MRN: [id number] Visit Date: 02/22/2024  Today's healthcare provider: Nancyann Perry, MD   Chief Complaint  Patient presents with   Acute Visit   Cough    Reports started as a cold on December 1st. Saw Dr.McQueen but has now gone into her chest with cough starting yesterday.She reports productive cough with yellow phlegm. Reports she started albuterol  yesterday for wheezing. At night she feels she has a weight on her chest. Taking advil from otc for her headache.    Subjective    Discussed the use of AI scribe software for clinical note transcription with the patient, who gave verbal consent to proceed.  History of Present Illness   Bianca Walton is a 53 year old female who presents with a respiratory infection and chest cough.  She has been experiencing a respiratory infection for the past couple of weeks. Initially, she had sinus issues, which have since resolved. She continues to use budesonide  nasal drops as prescribed by her ENT, who had previously performed sinus surgery on her.  Her symptoms have recently progressed to her chest, resulting in a productive cough that worsened significantly yesterday. She describes expectorating 'little chunks of yellow stuff'. No fever, chills, or sweats. She uses an albuterol  inhaler as needed, which she started using yesterday and again last night. She does not use it regularly and mentions not having Dulera , which was previously prescribed for daily use, and is unsure if she needs more of it.  She has a history of using Augmentin  for bronchial infections, which she finds effective, and notes that Z-Pak is not long enough or strong enough for her needs.       Medications: Show/hide medication list[1] Review of Systems     Objective    BP (!) 89/59 (BP Location: Left Arm, Patient Position: Sitting, Cuff Size: Normal)   Pulse 89   Ht 5' 7 (1.702  m)   Wt 136 lb 11.2 oz (62 kg)   SpO2 100%   BMI 21.41 kg/m   Physical Exam   General: Appearance:    Well developed, well nourished female in no acute distress  Eyes:    PERRL, conjunctiva/corneas clear, EOM's intact       Lungs:     Rare expiratory wheeze, respirations unlabored  Heart:    Normal heart rate. Normal rhythm. No murmurs, rubs, or gallops.    MS:   All extremities are intact.    Neurologic:   Awake, alert, oriented x 3. No apparent focal neurological defect.         Results for orders placed or performed in visit on 02/22/24  POCT Influenza A/B  Result Value Ref Range   Influenza A, POC Negative Negative   Influenza B, POC Negative Negative  POC COVID-19  Result Value Ref Range   SARS Coronavirus 2 Ag Negative Negative     Assessment & Plan        Acute bronchitis Likely viral etiology with productive cough. Considering underlying asthma will prescribe optional antibiotic to take if sx do not start to improving in 3-4 days.   Mild intermittent asthma exacerbation Managed with albuterol  inhaler. Start back on Dulera  until acute RI resolves.  - Prescribed Dulera  for use during recovery from respiratory infection.      Nancyann Perry, MD  [facility] [phone] (phone) [phone] (fax)  [facility]    [1]  Outpatient Medications Prior to Visit  Medication Sig   budesonide  (PULMICORT ) 0.5 MG/2ML nebulizer solution TAKE 2 MLS (0.5 MG TOTAL) BY NEBULIZATION DAILY.   citalopram (CELEXA) 20 MG tablet Take 20 mg by mouth daily.   estradiol  (VIVELLE -DOT) 0.1 MG/24HR patch Place 1 patch onto the skin 2 (two) times a week.   fluticasone (FLONASE) 50 MCG/ACT nasal spray Place 2 sprays into both nostrils daily.   progesterone  (PROMETRIUM ) 100 MG capsule Take 1 capsule (100 mg total) by mouth at bedtime. (Patient taking differently: Take 100 mg by mouth at bedtime. Takes 1 capsule on Monday, Wednesday, and Friday.)    albuterol  (VENTOLIN  HFA) 108 (90 Base) MCG/ACT inhaler 2 puffs every 6 (six) hours as needed for wheezing.   benzonatate  (TESSALON  PERLES) 100 MG capsule Take 2 capsules (200 mg total) by mouth 3 (three) times daily as needed for cough. (Patient not taking: Reported on 02/22/2024)   citalopram (CELEXA) 10 MG tablet Take 10 mg by mouth daily. (Patient not taking: Reported on 02/22/2024)   Diclofenac  35 MG CAPS  (Patient not taking: Reported on 02/22/2024)   fluconazole (DIFLUCAN) 150 MG tablet Take 150 mg by mouth once as needed. (Patient not taking: Reported on 02/22/2024)   No facility-administered medications prior to visit.   "
